# Patient Record
Sex: Male | Born: 1945 | Race: Black or African American | Hispanic: No | Marital: Married | Smoking: Never smoker
Health system: Southern US, Community
[De-identification: ages and names within clinical notes are randomized; demographics above are authoritative.]

## PROBLEM LIST (undated history)

## (undated) DIAGNOSIS — F419 Anxiety disorder, unspecified: Secondary | ICD-10-CM

## (undated) DIAGNOSIS — T7840XA Allergy, unspecified, initial encounter: Secondary | ICD-10-CM

## (undated) DIAGNOSIS — F329 Major depressive disorder, single episode, unspecified: Secondary | ICD-10-CM

## (undated) DIAGNOSIS — K219 Gastro-esophageal reflux disease without esophagitis: Secondary | ICD-10-CM

## (undated) DIAGNOSIS — F32A Depression, unspecified: Secondary | ICD-10-CM

## (undated) HISTORY — DX: Allergy, unspecified, initial encounter: T78.40XA

## (undated) HISTORY — DX: Gastro-esophageal reflux disease without esophagitis: K21.9

## (undated) HISTORY — DX: Anxiety disorder, unspecified: F41.9

## (undated) HISTORY — DX: Major depressive disorder, single episode, unspecified: F32.9

## (undated) HISTORY — DX: Depression, unspecified: F32.A

---

## 2000-06-30 ENCOUNTER — Ambulatory Visit (HOSPITAL_COMMUNITY): Admission: RE | Admit: 2000-06-30 | Discharge: 2000-06-30 | Payer: Self-pay | Admitting: Gastroenterology

## 2002-09-30 HISTORY — PX: KNEE SURGERY: SHX244

## 2003-09-05 ENCOUNTER — Ambulatory Visit (HOSPITAL_COMMUNITY): Admission: RE | Admit: 2003-09-05 | Discharge: 2003-09-05 | Payer: Self-pay | Admitting: Gastroenterology

## 2005-05-31 ENCOUNTER — Encounter: Admission: RE | Admit: 2005-05-31 | Discharge: 2005-05-31 | Payer: Self-pay | Admitting: Emergency Medicine

## 2005-06-02 ENCOUNTER — Encounter: Admission: RE | Admit: 2005-06-02 | Discharge: 2005-06-02 | Payer: Self-pay | Admitting: Emergency Medicine

## 2012-01-27 ENCOUNTER — Ambulatory Visit (INDEPENDENT_AMBULATORY_CARE_PROVIDER_SITE_OTHER): Payer: Federal, State, Local not specified - PPO | Admitting: Family Medicine

## 2012-01-27 VITALS — BP 122/83 | HR 68 | Temp 98.3°F | Resp 16 | Ht 70.0 in | Wt 198.0 lb

## 2012-01-27 DIAGNOSIS — H66009 Acute suppurative otitis media without spontaneous rupture of ear drum, unspecified ear: Secondary | ICD-10-CM

## 2012-01-27 DIAGNOSIS — H9203 Otalgia, bilateral: Secondary | ICD-10-CM

## 2012-01-27 DIAGNOSIS — J309 Allergic rhinitis, unspecified: Secondary | ICD-10-CM

## 2012-01-27 DIAGNOSIS — J329 Chronic sinusitis, unspecified: Secondary | ICD-10-CM

## 2012-01-27 DIAGNOSIS — H6690 Otitis media, unspecified, unspecified ear: Secondary | ICD-10-CM

## 2012-01-27 DIAGNOSIS — H9209 Otalgia, unspecified ear: Secondary | ICD-10-CM

## 2012-01-27 MED ORDER — FLUTICASONE PROPIONATE 50 MCG/ACT NA SUSP: 2.0000 | Freq: Every day | NASAL | Status: DC

## 2012-01-27 MED ORDER — AMOXICILLIN 875 MG PO TABS: 875.0000 mg | ORAL_TABLET | Freq: Two times a day (BID) | ORAL | Status: AC

## 2012-01-27 NOTE — Progress Notes (Signed)
S:  66 yo at [employer] for one more year.  Complains of sinus congestion (blocked nasal passages) x 2 weeks (h/o allergies), otalgia and sore throat.  The latter two are more recent, and otalgia was originally on the left, and is now more on the right  O:  NAD Moderate erythema bilateral TM's Throat ok Nose:  Red passges Neck supple, no adenopathy Oroph:  Clear  A:  Otitis with sinusitis  P:  Amoxicillin x 7 d

## 2012-05-26 ENCOUNTER — Ambulatory Visit: Payer: Federal, State, Local not specified - PPO

## 2012-05-26 ENCOUNTER — Ambulatory Visit (INDEPENDENT_AMBULATORY_CARE_PROVIDER_SITE_OTHER): Payer: Federal, State, Local not specified - PPO | Admitting: Family Medicine

## 2012-05-26 VITALS — BP 124/82 | HR 73 | Temp 98.1°F | Resp 16 | Ht 69.58 in | Wt 198.4 lb

## 2012-05-26 DIAGNOSIS — K219 Gastro-esophageal reflux disease without esophagitis: Secondary | ICD-10-CM

## 2012-05-26 DIAGNOSIS — M79609 Pain in unspecified limb: Secondary | ICD-10-CM

## 2012-05-26 DIAGNOSIS — B009 Herpesviral infection, unspecified: Secondary | ICD-10-CM

## 2012-05-26 DIAGNOSIS — M79671 Pain in right foot: Secondary | ICD-10-CM

## 2012-05-26 LAB — URIC ACID: Uric Acid, Serum: 7 mg/dL (ref 4.0–7.8)

## 2012-05-26 MED ORDER — VALACYCLOVIR HCL 1 G PO TABS: ORAL_TABLET | ORAL | Status: DC

## 2012-05-26 NOTE — Patient Instructions (Addendum)
Fever Blisters, Herpes Simplex Herpes simplex is a virus. This virus causes fever blisters or cold sores. Fever blisters are small sores on the lips, gums, or roof of the mouth. People often get infected with this herpes virus but do not have any symptoms. The blisters may break out when a person is:  Tired.   Under stress.   Suffering from another infection (such as a cold).   Exposed to sunlight.  The blisters usually heal within 1 week. The virus can be easily passed to other people and to other parts of the body, such as the eyes and sex organs. CAUSES  A virus, herpes simplex, is the cause of fever blisters. This virus can be passed (transmitted) from person to person and is therefore contagious. There are 2 types of herpes simplex virus. Type 1 usually causes oral herpes or fever blisters. Type 2 usually causes genital herpes. Both viruses do have the potential to cause oral and genital infections. However, the type 1 virus causes more than 90% of recurrent fever blister outbreaks.  Herpes simplex virus is highly contagious when fever blisters are present. Close contact, including kissing, can spread the virus. Children often become infected by contact with others who have fever blisters. A child can spread the virus by rubbing the cold sore and touching other children or when other children touch clothing, wipes, or toys contaminated by an infected child with the virus. In adults, about 10% of oral herpes infections are from oral-genital sex with a person who has active genital herpes (type 2).  Type 1 herpes infection is very common, eventually occurring in up to 8 out of 10 otherwise healthy people. Most people become infected before they are 65 years old. The virus usually infects the lips, throat, or mouth. Initial infection in children can be extensive with many lesions throughout the mouth. In adults, the first infection may cause no symptoms. Some adults may develop many fluid-filled  blisters inside and outside the mouth 3 to 5 days after they are initially infected but severe infection is uncommon. Fever, swollen neck glands, and general aches may occur but this is also uncommon. The blisters tend to come together and then collapse. When on the lip, a yellowish crust forms over the sores. Healing of the area without scarring typically occurs within 2 weeks. Once a person is infected, the herpes virus permanently remains alive in the body within a nerve near the cheekbone. It then stays inactive at this site, only to sometimes travel down the nerve to the skin. This causes a recurrence of fever blisters. Recurrent blisters usually break out at the outside edge of the lip or edge of the nostril. Recurrent fever blisters may occasionally occur on the chin, cheeks, or inside the mouth. Recurrent fever blister attacks are usually not as painful and not as numerous as the first infection. Recurrences are less frequent after age 35. Many people who have recurring fever blisters feel itching, tingling, or burning at the lip border. This can occur hours or a couple days before the blister appears.  Factors which weaken the body's immune system may trigger an outbreak or recurrence of herpes. These include some drugs (such as steroids), emotional stress, fever, illness, sleep deprivation, and other injuries. Sunlight may also trigger an outbreak. Many women have recurrences only during their menstrual period.  TREATMENT There is no cure for fever blisters. There is no vaccine for herpes simplex virus.  Certain medicines can relieve some of the pain   and discomfort of the sores or promote more rapid healing. These include ointments that numb the blisters and medicines that control bacterial infections (antibiotics). A number of drugs active against herpes viruses (antivirals), either applied locally as a gel or cream, or taken in pill form, may promote healing by keeping the virus from multiplying  and infecting more local tissue.   Keep fever blisters clean and dry. This helps to prevent bacterial invasion of the virally infected tissues.   Eat a soft, bland diet to avoid irritating the sores.   Be careful not to touch the sores and spread the virus to new sites, such as:   Other areas of the face.   Eyes.   Genitals.   Make sure you do not infect others. Avoid kissing people when a fever blister is present. Avoid touching the sores and then touching others.   Sunscreen on the lips can prevent recurrences if outbreaks are triggered by sunlight. The sunscreen should be put on before going outside and reapplied often while in the sun.   Avoid stress if this seems to cause outbreaks.  HOME CARE INSTRUCTIONS   Only take over-the-counter or prescription medicines for pain, discomfort, or fever as directed by your caregiver. Do not use aspirin.   Do not touch the blisters or pick the scabs. Wash your hands often. Do not touch your eyes without washing your hands first.   Avoid close contact with other people, especially kissing, until blisters heal.   Hot, cold, or salty foods may hurt your mouth. Use a straw to drink. Eating a well-balanced diet will help healing.  SEEK MEDICAL CARE IF:   Your eye feels irritated, painful, or you feel like you have something in your eye.   You develop a fever, feel achy, or see pus instead of clear fluid in the sores. These are signs of a bacterial infection.   You get blisters on your genitals.   You develop new, unexplained symptoms.  MAKE SURE YOU:   Understand these instructions.   Will watch your condition.   Will get help right away if you are not doing well or get worse.  Document Released: 09/16/2005 Document Revised: 09/05/2011 Document Reviewed: 01/21/2008 [facility] Patient Information 2012 [place], [place].  FOOT PAIN  Wear comfortable shoes Take aleve 2 pills twice daily if needed for foot pain  Gout Gout is an  inflammatory condition (arthritis) caused by a buildup of uric acid crystals in the joints. Uric acid is a chemical that is normally present in the blood. Under some circumstances, uric acid can form into crystals in your joints. This causes joint redness, soreness, and swelling (inflammation). Repeat attacks are common. Over time, uric acid crystals can form into masses (tophi) near a joint, causing disfigurement. Gout is treatable and often preventable. CAUSES  The disease begins with elevated levels of uric acid in the blood. Uric acid is produced by your body when it breaks down a naturally found substance called purines. This also happens when you eat certain foods such as meats and fish. Causes of an elevated uric acid level include:  Being passed down from parent to child (heredity).   Diseases that cause increased uric acid production (obesity, psoriasis, some cancers).   Excessive alcohol use.   Diet, especially diets rich in meat and seafood.   Medicines, including certain cancer-fighting drugs (chemotherapy), diuretics, and aspirin.   Chronic kidney disease. The kidneys are no longer able to remove uric acid well.   Problems  with metabolism.  Conditions strongly associated with gout include:  Obesity.   High blood pressure.   High cholesterol.   Diabetes.  Not everyone with elevated uric acid levels gets gout. It is not understood why some people get gout and others do not. Surgery, joint injury, and eating too much of certain foods are some of the factors that can lead to gout. SYMPTOMS   An attack of gout comes on quickly. It causes intense pain with redness, swelling, and warmth in a joint.   Fever can occur.   Often, only one joint is involved. Certain joints are more commonly involved:   Base of the big toe.   Knee.   Ankle.   Wrist.   Finger.  Without treatment, an attack usually goes away in a few days to weeks. Between attacks, you usually will not have  symptoms, which is different from many other forms of arthritis. DIAGNOSIS  Your caregiver will suspect gout based on your symptoms and exam. Removal of fluid from the joint (arthrocentesis) is done to check for uric acid crystals. Your caregiver will give you a medicine that numbs the area (local anesthetic) and use a needle to remove joint fluid for exam. Gout is confirmed when uric acid crystals are seen in joint fluid, using a special microscope. Sometimes, blood, urine, and X-ray tests are also used. TREATMENT  There are 2 phases to gout treatment: treating the sudden onset (acute) attack and preventing attacks (prophylaxis). Treatment of an Acute Attack  Medicines are used. These include anti-inflammatory medicines or steroid medicines.   An injection of steroid medicine into the affected joint is sometimes necessary.   The painful joint is rested. Movement can worsen the arthritis.   You may use warm or cold treatments on painful joints, depending which works best for you.   Discuss the use of coffee, vitamin C, or cherries with your caregiver. These may be helpful treatment options.  Treatment to Prevent Attacks After the acute attack subsides, your caregiver may advise prophylactic medicine. These medicines either help your kidneys eliminate uric acid from your body or decrease your uric acid production. You may need to stay on these medicines for a very long time. The early phase of treatment with prophylactic medicine can be associated with an increase in acute gout attacks. For this reason, during the first few months of treatment, your caregiver may also advise you to take medicines usually used for acute gout treatment. Be sure you understand your caregiver's directions. You should also discuss dietary treatment with your caregiver. Certain foods such as meats and fish can increase uric acid levels. Other foods such as dairy can decrease levels. Your caregiver can give you a list of  foods to avoid. HOME CARE INSTRUCTIONS   Do not take aspirin to relieve pain. This raises uric acid levels.   Only take over-the-counter or prescription medicines for pain, discomfort, or fever as directed by your caregiver.   Rest the joint as much as possible. When in bed, keep sheets and blankets off painful areas.   Keep the affected joint raised (elevated).   Use crutches if the painful joint is in your leg.   Drink enough water and fluids to keep your urine clear or pale yellow. This helps your body get rid of uric acid. Do not drink alcoholic beverages. They slow the passage of uric acid.   Follow your caregiver's dietary instructions. Pay careful attention to the amount of protein you eat. Your daily  diet should emphasize fruits, vegetables, whole grains, and fat-free or low-fat milk products.   Maintain a healthy body weight.  SEEK MEDICAL CARE IF:   You have an oral temperature above 102 F (38.9 C).   You develop diarrhea, vomiting, or any side effects from medicines.   You do not feel better in 24 hours, or you are getting worse.  SEEK IMMEDIATE MEDICAL CARE IF:   Your joint becomes suddenly more tender and you have:   Chills.   An oral temperature above 102 F (38.9 C), not controlled by medicine.  MAKE SURE YOU:   Understand these instructions.   Will watch your condition.   Will get help right away if you are not doing well or get worse.  Document Released: 09/13/2000 Document Revised: 09/05/2011 Document Reviewed: 12/25/2009 [facility] Patient Information 2012 [place], [place].

## 2012-05-26 NOTE — Progress Notes (Signed)
Subjective: 66 year old man who is here for he has painful right large to at the MTP joint. He knows of no injury. Bothering him for about 10 days. He just came on with a sharp pain at the base of the foot. He works a Office manager, used to Development worker, [employer]. He does not play sports. He has not noted any injury. He has not had this before. He has no history of gout. His only regular medication is his Nexium, and some vitamins.  Incidentally he complained about place coming up on his left left lower lip. Says he gets these about every 4 months. His wife also gets them. She said she got some medication for them one time.  Objective: 66 year old man in no major distress. His foot grossly looks normal. Pedal pulses are good. The foot is not swollen or red. There is a tender spot on the ball of the foot at the base of the first MTP joint.  Early fever blister left lower lip  Assessment: Foot pain HSV I  Plan: X-ray, BMET, uric acid  Valcyclovir  Differential includes gout, bone spur, stress fracture, or just some kind of a bruise there.  [facility] reading (PRIMARY) by  Dr. Alwyn Ren Normal xray .  Aleve prn

## 2012-07-10 ENCOUNTER — Ambulatory Visit (INDEPENDENT_AMBULATORY_CARE_PROVIDER_SITE_OTHER): Payer: Federal, State, Local not specified - PPO | Admitting: Radiology

## 2012-07-10 DIAGNOSIS — Z0000 Encounter for general adult medical examination without abnormal findings: Secondary | ICD-10-CM

## 2012-07-10 DIAGNOSIS — Z23 Encounter for immunization: Secondary | ICD-10-CM

## 2013-02-11 ENCOUNTER — Ambulatory Visit (INDEPENDENT_AMBULATORY_CARE_PROVIDER_SITE_OTHER): Payer: Federal, State, Local not specified - PPO | Admitting: Family Medicine

## 2013-02-11 VITALS — BP 120/62 | HR 68 | Temp 97.5°F | Resp 16 | Ht 69.5 in | Wt 189.0 lb

## 2013-02-11 DIAGNOSIS — J029 Acute pharyngitis, unspecified: Secondary | ICD-10-CM

## 2013-02-11 DIAGNOSIS — J309 Allergic rhinitis, unspecified: Secondary | ICD-10-CM

## 2013-02-11 DIAGNOSIS — K219 Gastro-esophageal reflux disease without esophagitis: Secondary | ICD-10-CM | POA: Insufficient documentation

## 2013-02-11 MED ORDER — FIRST-DUKES MOUTHWASH MT SUSP: OROMUCOSAL | Status: DC

## 2013-02-11 MED ORDER — IPRATROPIUM BROMIDE 0.03 % NA SOLN: 2.0000 | Freq: Four times a day (QID) | NASAL | Status: DC

## 2013-02-11 NOTE — Patient Instructions (Addendum)
I will be in touch with the results of your throat culture. Let me know if you are not getting better in the next few days- Sooner if worse.

## 2013-02-11 NOTE — Progress Notes (Signed)
[facility] [address], [place] [address] [phone]  Date:  02/11/2013   Name:  Andrew Wolf   DOB:  1946-02-10   MRN:  [id number]  PCP:  Elvina Sidle, MD    Chief Complaint: No chief complaint on file.   History of Present Illness:  Andrew Wolf is a 67 y.o. very pleasant male patient who presents with the following:  He has noted a ST for about 2 days, but it was worse last night and kept him from sleeping.  He has tried gargling with hydrogen peroxide, which helps for a little while.    He has not noted a fever, no aches or chills.  He does have a cough, and has allergies so he typically has a runny nose.   Cough has been present for about 3 days- not productive.   No GI symptoms He was recently exposed to someone with a ST- a coworker  There are no active problems to display for this patient.   No past medical history on file.  No past surgical history on file.  History  Substance Use Topics  . Smoking status: Passive Smoke Exposure - Never Smoker    Types: Cigars  . Smokeless tobacco: Not on file  . Alcohol Use: Not on file    No family history on file.  No Known Allergies  Medication list has been reviewed and updated.  Current Outpatient Prescriptions on File Prior to Visit  Medication Sig Dispense Refill  . esomeprazole (NEXIUM) 40 MG capsule Take 40 mg by mouth 2 (two) times daily.      . Niacin (VITAMIN B-3 PO) Take 2 tablets by mouth daily.      . valACYclovir (VALTREX) 1000 MG tablet Use 2 tablets initially, then 2 in 12 hours for fever blisters  8 tablet  1  . fluticasone (FLONASE) 50 MCG/ACT nasal spray Place 2 sprays into the nose daily.  1 g  11   No current facility-administered medications on file prior to visit.    Review of Systems:  As per HPI- otherwise negative. Reports no history of BPH or glaucoma   Physical Examination: Filed Vitals:   02/11/13 1347  BP: 120/62  Pulse: 68  Temp: 97.5 F  (36.4 C)  Resp: 16   Filed Vitals:   02/11/13 1347  Height: 5' 9.5" (1.765 m)  Weight: 189 lb (85.73 kg)   Body mass index is 27.52 kg/(m^2). Ideal Body Weight: Weight in (lb) to have BMI = 25: 171.4  GEN: WDWN, NAD, Non-toxic, A & O x 3, looks well HEENT: Atraumatic, Normocephalic. Neck supple. No masses, No LAD.  Bilateral TM wnl, oropharynx normal.  PEERL,EOMI.  Ears and Nose: No external deformity. CV: RRR, No M/G/R. No JVD. No thrill. No extra heart sounds. PULM: CTA B, no wheezes, crackles, rhonchi. No retractions. No resp. distress. No accessory muscle use. EXTR: No c/c/e NEURO Normal gait.  PSYCH: Normally interactive. Conversant. Not depressed or anxious appearing.  Calm demeanor.   Results for orders placed in visit on 02/11/13  POCT RAPID STREP A (OFFICE)      Result Value Range   Rapid Strep A Screen Negative  Negative     Assessment and Plan: Acute pharyngitis - Plan: POCT rapid strep A, Culture, Group A Strep  GERD (gastroesophageal reflux disease)  Allergic rhinitis - Plan: ipratropium (ATROVENT) 0.03 % nasal spray, Diphenhyd-Hydrocort-Nystatin (FIRST-DUKES MOUTHWASH) SUSP  likely viral ST.  atrovent NS for PND.  DMM as needed for pharyngitis.  Patient (or parent if minor) instructed to return to clinic or call if not better in 2-3 day(s).   Signed Abbe Amsterdam, MD

## 2013-02-13 LAB — CULTURE, GROUP A STREP

## 2013-07-15 ENCOUNTER — Ambulatory Visit: Payer: Federal, State, Local not specified - PPO

## 2013-07-15 ENCOUNTER — Ambulatory Visit (INDEPENDENT_AMBULATORY_CARE_PROVIDER_SITE_OTHER): Payer: Federal, State, Local not specified - PPO | Admitting: Internal Medicine

## 2013-07-15 VITALS — BP 150/84 | HR 62 | Temp 97.8°F | Resp 18 | Ht 70.0 in | Wt 195.0 lb

## 2013-07-15 DIAGNOSIS — Z23 Encounter for immunization: Secondary | ICD-10-CM

## 2013-07-15 DIAGNOSIS — M546 Pain in thoracic spine: Secondary | ICD-10-CM

## 2013-07-15 MED ORDER — METHOCARBAMOL 750 MG PO TABS: 750.0000 mg | ORAL_TABLET | Freq: Four times a day (QID) | ORAL | Status: DC

## 2013-07-15 MED ORDER — HYDROCODONE-ACETAMINOPHEN 5-325 MG PO TABS: 1.0000 | ORAL_TABLET | Freq: Four times a day (QID) | ORAL | Status: DC | PRN

## 2013-07-15 NOTE — Patient Instructions (Signed)
Scapular Winging  with Rehab  Scapular winging syndrome is also known as serratus anterior palsy or long thoracic nerve injury. The condition is an uncommon injury to the nervous system. The condition is caused by injury to the long thoracic nerve that runs through the neck and shoulder. Injury to the shoulder, such as a fall or repetitive stress on the shoulder causes the nerve to become stretched. Occasionally the injury is the result of an infection of the nerve. Damage to the long thoracic nerve results in weakness of the serratus anterior muscle. The serratus anterior muscle is responsible for controlling the shoulder blade (scapula). Weakness in this muscle results in a instability (winging) of the scapula. SYMPTOMS   Pain and weakness in the shoulder (usually the back of the shoulder) that is often diffuse or unable to localize.  Loss of or decrease in shoulder function.  Upper back pain while sitting, due to the scapula pressing on the back of the chair.  Visible deformity in the back of the shoulder. CAUSES  Scapular winging is caused by stretching of the long thoracic nerve. Common mechanisms of injury include:  Viral illness.  Repetitive and/or stressful use of the shoulder.  Falling onto the shoulder with the head and neck stretched away from the shoulder. RISK INCREASES WITH:  Contact sports (football, rugby, lacrosse, or soccer).  Activities involving overhead arm movement (baseball, volleyball, or racquet sports).  Poor strength and flexibility. PREVENTION  Warm up and stretch properly before activity.  Allow for adequate recovery between workouts.  Maintain physical fitness:  Strength, flexibility, and endurance.  Cardiovascular fitness.  Learn and use proper technique. When possible, have a coach correct improper technique. PROGNOSIS  Scapular winging normally resolves spontaneously within 18 months. In rare circumstances surgery is recommended.  RELATED  COMPLICATIONS   Permanent nerve damage, including pain, numbness, tingle, or weakness.  Shoulder weakness.  Recurrent shoulder pain.  Inability to compete in athletics. TREATMENT Treatment initially involves resting from any activities that aggravate your symptoms. The use of ice and medication may help reduce pain and inflammation. The use of strengthening and stretching exercises may help reduce pain with activity, specifically shoulder exercises that improve range of motion. These exercises may be performed at home or with referral to a therapist. If symptoms persist for greater than 6 months despite non-surgical (conservative) treatment, then surgery may be recommended. Surgery is only used for the most serious cases and the purpose is to regain function, not to allow an athlete to return to sports. MEDICATION   If pain medication is necessary, then nonsteroidal anti-inflammatory medications, such as aspirin and ibuprofen, or other minor pain relievers, such as acetaminophen, are often recommended.  Do not take pain medication for 7 days before surgery.  Prescription pain relievers may be given if deemed necessary by your caregiver. Use only as directed and only as much as you need. HEAT AND COLD  Cold treatment (icing) relieves pain and reduces inflammation. Cold treatment should be applied for 10 to 15 minutes every 2 to 3 hours for inflammation and pain and immediately after any activity that aggravates your symptoms. Use ice packs or massage the area with a piece of ice (ice massage).  Heat treatment may be used prior to performing the stretching and strengthening activities prescribed by your caregiver, physical therapist, or athletic trainer. Use a heat pack or soak the injury in warm water. SEEK MEDICAL CARE IF:  Treatment seems to offer no benefit, or the condition worsens.  Any medications produce adverse side effects. EXERCISES  RANGE OF MOTION (ROM) AND STRETCHING  EXERCISES - Scapular Winging (Serratus Anterior Palsy, Long Thoracic Nerve Injury)  These exercises may help you when beginning to rehabilitate your injury. Your symptoms may resolve with or without further involvement from your physician, physical therapist or athletic trainer. While completing these exercises, remember:   Restoring tissue flexibility helps normal motion to return to the joints. This allows healthier, less painful movement and activity.  An effective stretch should be held for at least 30 seconds.  A stretch should never be painful. You should only feel a gentle lengthening or release in the stretched tissue. ROM - Pendulum  Bend at the waist so that your right / left arm falls away from your body. Support yourself with your opposite hand on a solid surface, such as a table or a countertop.  Your right / left arm should be perpendicular to the ground. If it is not perpendicular, you need to lean over farther. Relax the muscles in your right / left arm and shoulder as much as possible.  Gently sway your hips and trunk so they move your right / left arm without any use of your right / left shoulder muscles.  Progress your movements so that your right / left arm moves side to side, then forward and backward, and finally, both clockwise and counterclockwise.  Complete __________ repetitions in each direction. Many people use this exercise to relieve discomfort in their shoulder as well as to gain range of motion. Repeat __________ times. Complete this exercise __________ times per day. STRETCH  Flexion, Seated   Sit in a firm chair so that your right / left forearm can rest on a table or on a table or countertop. Your right / left elbow should rest below the height of your shoulder so that your shoulder feels supported and not tense or uncomfortable.  Keeping your right / left shoulder relaxed, lean forward at your waist, allowing your right / left hand to slide forward. Bend  forward until you feel a moderate stretch in your shoulder, but before you feel an increase in your pain.  Hold __________ seconds. Slowly return to your starting position. Repeat __________ times. Complete this exercise __________ times per day.  STRETCH  Flexion, Standing  Stand with good posture. With an underhand grip on your right / left and an overhand grip on the opposite hand, grasp a broomstick or cane so that your hands are a little more than shoulder-width apart.  Keeping your right / left elbow straight and shoulder muscles relaxed, push the stick with your opposite hand to raise your right / left arm in front of your body and then overhead. Raise your arm until you feel a stretch in your right / left shoulder, but before you have increased shoulder pain.  Avoid shrugging your right / left shoulder as your arm rises by keeping your shoulder blade tucked down and toward your mid-back spine. Hold __________ seconds.  Slowly return to the starting position. Repeat __________ times. Complete this exercise __________ times per day. STRETCH  Abduction, Supine  Stand with good posture. With an underhand grip on your right / left and an overhand grip on the opposite hand, grasp a broomstick or cane so that your hands are a little more than shoulder-width apart.  Keeping your right / left elbow straight and shoulder muscles relaxed, push the stick with your opposite hand to raise your right / left arm  out to the side of your body and then overhead. Raise your arm until you feel a stretch in your right / left shoulder, but before you have increased shoulder pain.  Avoid shrugging your right / left shoulder as your arm rises by keeping your shoulder blade tucked down and toward your mid-back spine. Hold __________ seconds.  Slowly return to the starting position. Repeat __________ times. Complete this exercise __________ times per day. ROM  Flexion, Active-Assisted  Lie on your back. You  may bend your knees for comfort.  Grasp a broomstick or cane so your hands are about shoulder-width apart. Your right / left hand should grip the end of the stick/cane so that your hand is positioned "thumbs-up," as if you were about to shake hands.  Using your healthy arm to lead, raise your right / left arm overhead until you feel a gentle stretch in your shoulder. Hold __________ seconds.  Use the stick/cane to assist in returning your right / left arm to its starting position. Repeat __________ times. Complete this exercise __________ times per day.  STRENGTHENING EXERCISES - Scapular Winging (Serratus Anterior Palsy, Long Thoracic Nerve Injury) These exercises may help you when beginning to rehabilitate your injury. They may resolve your symptoms with or without further involvement from your physician, physical therapist or athletic trainer. While completing these exercises, remember:   Muscles can gain both the endurance and the strength needed for everyday activities through controlled exercises.  Complete these exercises as instructed by your physician, physical therapist or athletic trainer. Progress with the resistance and repetition exercises only as your caregiver advises.  You may experience muscle soreness or fatigue, but the pain or discomfort you are trying to eliminate should never worsen during these exercises. If this pain does worsen, stop and make certain you are following the directions exactly. If the pain is still present after adjustments, discontinue the exercise until you can discuss the trouble with your clinician.  During your recovery, avoid activity or exercises which involve actions that place your injured hand or elbow above your head or behind your back or head. These positions stress the tissues which are trying to heal. STRENGTH - Scapular Depression and Adduction   With good posture, sit on a firm chair. Supported your arms in front of you with pillows, arm  rests or a table top. Have your elbows in line with the sides of your body.  Gently draw your shoulder blades down and toward your mid-back spine. Gradually increase the tension without tensing the muscles along the top of your shoulders and the back of your neck.  Hold for __________ seconds. Slowly release the tension and relax your muscles completely before completing the next repetition.  After you have practiced this exercise, remove the arm support and complete it in standing as well as sitting. Repeat __________ times. Complete this exercise __________ times per day.  STRENGTH - Scapular Protractors, Standing   Stand arms-length away from a wall. Place your hands on the wall, keeping your elbows straight.  Begin by dropping your shoulder blades down and toward your mid-back spine.  To strengthen your protractors, keep your shoulder blades down, but slide them forward on your rib cage. It will feel as if you are lifting the back of your rib cage away from the wall. This is a subtle motion and can be challenging to complete. Ask your clinician for further instruction if you are not sure you are doing the exercise correctly.  Hold for  __________ seconds. Slowly return to the starting position, resting the muscles completely before completing the next repetition. Repeat __________ times. Complete this exercise __________ times per day. STRENGTH - Scapular Protractors, Supine  Lie on your back on a firm surface. Extend your right / left arm straight into the air while holding a __________ weight in your hand.  Keeping your head and back in place, lift your shoulder off the floor.  Hold __________ seconds. Slowly return to the starting position and allow your muscles to relax completely before completing the next repetition. Repeat __________ times. Complete this exercise __________ times per day. STRENGTH - Scapular Protractors, Quadruped  Get onto your hands and knees with your  shoulders directly over your hands (or as close as you comfortably can be).  Keeping your elbows locked, lift the back of your rib cage up into your shoulder blades so your mid-back rounds-out. Keep your neck muscles relaxed.  Hold this position for __________ seconds. Slowly return to the starting position and allow your muscles to relax completely before completing the next repetition. Repeat __________ times. Complete this exercise __________ times per day.  STRENGTH  Scapular Depressors  Keeping your feet on the floor, lift your bottom from the seat and lock your elbows.  Keeping your elbows straight, allow gravity to pull your body weight down. Your shoulders will rise toward your ears.  Raise your body against gravity by drawing your shoulder blades down your back, shortening the distance between your shoulders and ears. Although your feet should always maintain contact with the floor, your feet should progressively support less body weight as you get stronger.  Hold __________ seconds. In a controlled and slow manner, lower your body weight to begin the next repetition. Repeat __________ times. Complete this exercise __________ times per day.  STRENGTH - Shoulder Extensors, Prone  Lie on your stomach on a firm surface so that your right / left arm overhangs the edge. Rest your forehead on your opposite forearm. With your thumb facing away from your body and your elbow straight, hold a __________ weight in your hand.  Squeeze your right / left shoulder blade to your mid-back spine and then slowly raise your arm behind you to the height of the bed.  Hold for __________ seconds. Slowly reverse the directions and return to the starting position, controlling the weight as you lower your arm. Repeat __________ times. Complete this exercise __________ times per day.  STRENGTH - Horizontal Abductors Choose one of the two oppositions to complete this exercise. Prone: lying on stomach:  Lie  on your stomach on a firm surface so that your right / left arm overhangs the edge. Rest your forehead on your opposite forearm. With your palm facing the floor and your elbow straight, hold a __________ weight in your hand.  Squeeze your right / left shoulder blade to your mid-back spine and then slowly raise your arm to the height of the bed.  Hold for __________ seconds. Slowly reverse the directions and return to the starting position, controlling the weight as you lower your arm. Repeat __________ times. Complete this exercise __________ times per day. Standing:  Secure a rubber exercise band/tubing so that it is at the height of your shoulders when you are either standing or sitting on a firm arm-less chair.  Grasp an end of the band/tubing in each hand and have your palms face each other. Straighten your elbows and lift your hands straight in front of you at shoulder height.  Step back away from the secured end of band/tubing until it becomes tense.  Squeeze your shoulder blades together. Keeping your elbows locked and your hands at shoulder-height, bring your hands out to your side.  Hold __________ seconds. Slowly ease the tension on the band/tubing as you reverse the directions and return to the starting position. Repeat __________ times. Complete this exercise __________ times per day. STRENGTH - Scapular Retractors  Secure a rubber exercise band/tubing so that it is at the height of your shoulders when you are either standing or sitting on a firm arm-less chair.  With a palm-down grip, grasp an end of the band/tubing in each hand. Straighten your elbows and lift your hands straight in front of you at shoulder height. Step back away from the secured end of band/tubing until it becomes tense.  Squeezing your shoulder blades together, draw your elbows back as you bend them. Keep your upper arm lifted away from your body throughout the exercise.  Hold __________ seconds. Slowly ease  the tension on the band/tubing as you reverse the directions and return to the starting position. Repeat __________ times. Complete this exercise __________ times per day. STRENGTH - Shoulder Extensors   Secure a rubber exercise band/tubing so that it is at the height of your shoulders when you are either standing or sitting on a firm arm-less chair.  With a thumbs-up grip, grasp an end of the band/tubing in each hand. Straighten your elbows and lift your hands straight in front of you at shoulder height. Step back away from the secured end of band/tubing until it becomes tense.  Squeezing your shoulder blades together, pull your hands down to the sides of your thighs. Do not allow your hands to go behind you.  Hold for __________ seconds. Slowly ease the tension on the band/tubing as you reverse the directions and return to the starting position. Repeat __________ times. Complete this exercise __________ times per day.  STRENGTH - Scapular Retractors and External Rotators  Secure a rubber exercise band/tubing so that it is at the height of your shoulders when you are either standing or sitting on a firm arm-less chair.  With a palm-down grip, grasp an end of the band/tubing in each hand. Bend your elbows 90 degrees and lift your elbows to shoulder height at your sides. Step back away from the secured end of band/tubing until it becomes tense.  Squeezing your shoulder blades together, rotate your shoulder so that your upper arm and elbow remain stationary, but your fists travel upward to head-height.  Hold __________ for seconds. Slowly ease the tension on the band/tubing as you reverse the directions and return to the starting position. Repeat __________ times. Complete this exercise __________ times per day.  STRENGTH - Scapular Retractors and External Rotators, Rowing  Secure a rubber exercise band/tubing so that it is at the height of your shoulders when you are either standing or sitting  on a firm arm-less chair.  With a palm-down grip, grasp an end of the band/tubing in each hand. Straighten your elbows and lift your hands straight in front of you at shoulder height. Step back away from the secured end of band/tubing until it becomes tense.  Step 1: Squeeze your shoulder blades together. Bending your elbows, draw your hands to your chest as if you are rowing a boat. At the end of this motion, your hands and elbow should be at shoulder-height and your elbows should be out to your sides.  Step 2: Rotate  your shoulder to raise your hands above your head. Your forearms should be vertical and your upper-arms should be horizontal.  Hold for __________ seconds. Slowly ease the tension on the band/tubing as you reverse the directions and return to the starting position. Repeat __________ times. Complete this exercise __________ times per day.  STRENGTH - Scapular Retractors and Elevators  Secure a rubber exercise band/tubing so that it is at the height of your shoulders when you are either standing or sitting on a firm arm-less chair.  With a thumbs-up grip, grasp an end of the band/tubing in each hand. Step back away from the secured end of band/tubing until it becomes tense.  Squeezing your shoulder blades together, straighten your elbows and lift your hands straight over your head.  Hold for __________ seconds. Slowly ease the tension on the band/tubing as you reverse the directions and return to the starting position. Repeat __________ times. Complete this exercise __________ times per day.  Document Released: 09/16/2005 Document Revised: 12/09/2011 Document Reviewed: 12/29/2008 [facility] Patient Information 2014 [place], [place]. Thoracic Strain You have injured the muscles or tendons that attach to the upper part of your back behind your chest. This injury is called a thoracic strain, thoracic sprain, or mid-back strain.  CAUSES  The cause of thoracic strain varies. A less  severe injury involves pulling a muscle or tendon without tearing it. A more severe injury involves tearing (rupturing) a muscle or tendon. With less severe injuries, there may be little loss of strength. Sometimes, there are breaks (fractures) in the bones to which the muscles are attached. These fractures are rare, unless there was a direct hit (trauma) or you have weak bones due to osteoporosis or age. Longstanding strains may be caused by overuse or improper form during certain movements. Obesity can also increase your risk for back injuries. Sudden strains may occur due to injury or not warming up properly before exercise. Often, there is no obvious cause for a thoracic strain. SYMPTOMS  The main symptom is pain, especially with movement, such as during exercise. DIAGNOSIS  Your caregiver can usually tell what is wrong by taking an X-ray and doing a physical exam. TREATMENT   Physical therapy may be helpful for recovery. Your caregiver can give you exercises to do or refer you to a physical therapist after your pain improves.  After your pain improves, strengthening and conditioning programs appropriate for your sport or occupation may be helpful.  Always warm up before physical activities or athletics. Stretching after physical activity may also help.  Certain over-the-counter medicines may also help. Ask your caregiver if there are medicines that would help you. If this is your first thoracic strain injury, proper care and proper healing time before starting activities should prevent long-term problems. Torn ligaments and tendons require as long to heal as broken bones. Average healing times may be only 1 week for a mild strain. For torn muscles and tendons, healing time may be up to 6 weeks to 2 months. HOME CARE INSTRUCTIONS   Apply ice to the injured area. Ice massages may also be used as directed.  Put ice in a plastic bag.  Place a towel between your skin and the bag.  Leave the  ice on for 15-20 minutes, 3-4 times a day, for the first 2 days.  Only take over-the-counter or prescription medicines for pain, discomfort, or fever as directed by your caregiver.  Keep your appointments for physical therapy if this was prescribed.  Use wraps  and back braces as instructed. SEEK IMMEDIATE MEDICAL CARE IF:   You have an increase in bruising, swelling, or pain.  Your pain has not improved with medicines.  You develop new shortness of breath, chest pain, or fever.  Problems seem to be getting worse rather than better. MAKE SURE YOU:   Understand these instructions.  Will watch your condition.  Will get help right away if you are not doing well or get worse. Document Released: 12/07/2003 Document Revised: 12/09/2011 Document Reviewed: 11/02/2010 [facility] Patient Information 2014 [place], [place].

## 2013-07-15 NOTE — Progress Notes (Signed)
  Subjective:    Patient ID: Andrew Wolf, male    DOB: 02-21-1946, 67 y.o.   MRN: [id number]  HPI Left subscapular pain 1 month   Review of Systems     Objective:   Physical Exam  Constitutional: He is oriented to person, place, and time. He appears well-developed and well-nourished.  HENT:  Head: Normocephalic.  Eyes: EOM are normal. Pupils are equal, round, and reactive to light.  Neck: Normal range of motion. Neck supple.  Cardiovascular: Normal rate.   Pulmonary/Chest: Effort normal.  Musculoskeletal: Normal range of motion. He exhibits tenderness. He exhibits no edema.       Cervical back: He exhibits normal range of motion, no tenderness, no bony tenderness, no swelling, no edema, no deformity, no laceration, no pain, no spasm and normal pulse.       Thoracic back: He exhibits tenderness, pain and spasm. He exhibits normal range of motion, no bony tenderness, no swelling, no edema, no deformity, no laceration and normal pulse.       Back:  Point tender at Xs Spurling test neg.  Neurological: He is alert and oriented to person, place, and time. No cranial nerve deficit. He exhibits normal muscle tone. Coordination normal.  Skin: No rash noted.  Psychiatric: He has a normal mood and affect. His behavior is normal. Judgment and thought content normal.     [facility] reading (PRIMARY) by  Dr Perrin Maltese elevated right hemi diaphragm       Assessment & Plan:  Thoracic strain/Subscapular pain Massage/Rolphing/Stretching Vicodin hs/Robaxin

## 2013-08-19 ENCOUNTER — Ambulatory Visit (INDEPENDENT_AMBULATORY_CARE_PROVIDER_SITE_OTHER): Payer: Federal, State, Local not specified - PPO | Admitting: Family Medicine

## 2013-08-19 ENCOUNTER — Encounter: Payer: Self-pay | Admitting: Family Medicine

## 2013-08-19 VITALS — BP 126/80 | HR 63 | Temp 98.3°F | Resp 16 | Ht 69.75 in | Wt 190.4 lb

## 2013-08-19 DIAGNOSIS — Z0000 Encounter for general adult medical examination without abnormal findings: Secondary | ICD-10-CM

## 2013-08-19 DIAGNOSIS — E559 Vitamin D deficiency, unspecified: Secondary | ICD-10-CM

## 2013-08-19 DIAGNOSIS — F4329 Adjustment disorder with other symptoms: Secondary | ICD-10-CM

## 2013-08-19 DIAGNOSIS — F438 Other reactions to severe stress: Secondary | ICD-10-CM

## 2013-08-19 DIAGNOSIS — Z23 Encounter for immunization: Secondary | ICD-10-CM

## 2013-08-19 LAB — POCT URINALYSIS DIPSTICK
Bilirubin, UA: NEGATIVE
Blood, UA: NEGATIVE
Glucose, UA: NEGATIVE
Ketones, UA: NEGATIVE
Leukocytes, UA: NEGATIVE
Nitrite, UA: NEGATIVE
Protein, UA: NEGATIVE
Spec Grav, UA: >=1.03
Urobilinogen, UA: 0.2
pH, UA: 5.5

## 2013-08-19 LAB — COMPREHENSIVE METABOLIC PANEL
ALT: 13 U/L (ref 0–53)
AST: 20 U/L (ref 0–37)
Albumin: 4.4 g/dL (ref 3.5–5.2)
Alkaline Phosphatase: 88 U/L (ref 39–117)
BUN: 13 mg/dL (ref 6–23)
CO2: 25 mEq/L (ref 19–32)
Calcium: 9.7 mg/dL (ref 8.4–10.5)
Chloride: 101 mEq/L (ref 96–112)
Creat: 1.18 mg/dL (ref 0.50–1.35)
Glucose, Bld: 86 mg/dL (ref 70–99)
Potassium: 4.2 mEq/L (ref 3.5–5.3)
Sodium: 138 mEq/L (ref 135–145)
Total Bilirubin: 0.6 mg/dL (ref 0.3–1.2)
Total Protein: 7.7 g/dL (ref 6.0–8.3)

## 2013-08-19 LAB — CBC WITH DIFFERENTIAL/PLATELET
Basophils Absolute: 0 10*3/uL (ref 0.0–0.1)
Basophils Relative: 1 % (ref 0–1)
Eosinophils Absolute: 0.1 10*3/uL (ref 0.0–0.7)
Eosinophils Relative: 1 % (ref 0–5)
HCT: 40.7 % (ref 39.0–52.0)
Hemoglobin: 14 g/dL (ref 13.0–17.0)
Lymphocytes Relative: 30 % (ref 12–46)
Lymphs Abs: 1.9 10*3/uL (ref 0.7–4.0)
MCH: 29.8 pg (ref 26.0–34.0)
MCHC: 34.4 g/dL (ref 30.0–36.0)
MCV: 86.6 fL (ref 78.0–100.0)
Monocytes Absolute: 0.3 10*3/uL (ref 0.1–1.0)
Monocytes Relative: 5 % (ref 3–12)
Neutro Abs: 3.9 10*3/uL (ref 1.7–7.7)
Neutrophils Relative %: 63 % (ref 43–77)
Platelets: 375 10*3/uL (ref 150–400)
RBC: 4.7 MIL/uL (ref 4.22–5.81)
RDW: 13.4 % (ref 11.5–15.5)
WBC: 6.2 10*3/uL (ref 4.0–10.5)

## 2013-08-19 LAB — LIPID PANEL
Cholesterol: 193 mg/dL (ref 0–200)
HDL: 43 mg/dL (ref >39)
LDL Cholesterol: 121 mg/dL — ABNORMAL HIGH (ref 0–99)
Total CHOL/HDL Ratio: 4.5 Ratio
Triglycerides: 146 mg/dL (ref <150)
VLDL: 29 mg/dL (ref 0–40)

## 2013-08-19 LAB — POCT UA - MICROSCOPIC ONLY
Bacteria, U Microscopic: NEGATIVE
Casts, Ur, LPF, POC: NEGATIVE
Crystals, Ur, HPF, POC: NEGATIVE
Yeast, UA: NEGATIVE

## 2013-08-19 LAB — IFOBT (OCCULT BLOOD): IFOBT: NEGATIVE

## 2013-08-19 MED ORDER — CITALOPRAM HYDROBROMIDE 20 MG PO TABS: 20.0000 mg | ORAL_TABLET | Freq: Every day | ORAL | Status: DC

## 2013-08-19 MED ORDER — ZOSTER VACCINE LIVE 19400 UNT/0.65ML ~~LOC~~ SOLR: 0.6500 mL | Freq: Once | SUBCUTANEOUS | Status: DC

## 2013-08-19 NOTE — Progress Notes (Signed)
  Subjective:    Patient ID: Andrew Wolf, male    DOB: 08-02-1946, 67 y.o.   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  Wife Porfirio Mylar also works for [employer] Patient under stress and anxiety.  He sees Richard S. Excell Seltzer, Ph.D., [address], [place], [place] [address].   Mother in law with alzheimers is now living with them.  She requires full attention, lifting her out of bed, bathing her and feeding her baby food.  Hospice is assisting.   Review of Systems  Constitutional: Negative.   HENT: Positive for postnasal drip.   Eyes: Positive for itching.  Respiratory: Negative.   Cardiovascular: Negative.   Gastrointestinal: Negative.   Endocrine: Negative.   Genitourinary: Positive for urgency.  Musculoskeletal: Positive for myalgias.  Skin: Negative.   Allergic/Immunologic: Positive for environmental allergies.  Neurological: Negative.   Hematological: Negative.   Psychiatric/Behavioral: Positive for sleep disturbance, decreased concentration and agitation. The patient is nervous/anxious.        Objective:   Physical Exam Alert, NAD HEENT: Unremarkable Chest: Clear Heart: Regular no murmur Neck: Supple, no adenopathy, palpable midline thyroid without nodularity with possible slight enlargement Abdomen: Soft nontender without HSM or hernia Genitalia: No hernia, normal circumcised male Rectal: Normal Results for orders placed in visit on 08/19/13  POCT UA - MICROSCOPIC ONLY      Result Value Range   WBC, Ur, HPF, POC 0-3     RBC, urine, microscopic 0-2     Bacteria, U Microscopic neg     Mucus, UA small     Epithelial cells, urine per micros 0-2     Crystals, Ur, HPF, POC neg     Casts, Ur, LPF, POC neg     Yeast, UA neg    POCT URINALYSIS DIPSTICK      Result Value Range   Color, UA yellow     Clarity, UA clear     Glucose, UA neg     Bilirubin, UA neg     Ketones, UA neg     Spec Grav, UA >=1.030     Blood, UA neg     pH, UA 5.5     Protein, UA  neg     Urobilinogen, UA 0.2     Nitrite, UA neg     Leukocytes, UA Negative    IFOBT (OCCULT BLOOD)      Result Value Range   IFOBT Negative         Assessment & Plan:  Patient is obviously under a lot of stress with issues at home and work is also an issue. I think is reasonable that he take a leave of absence and possibly retire. His exam is fairly normal today.  Plan:Annual physical exam - Plan: CBC with Differential, Comprehensive metabolic panel, Lipid panel, POCT UA - Microscopic Only, POCT urinalysis dipstick, PSA, IFOBT POC (occult bld, rslt in office)  Need for zoster vaccination - Plan: zoster vaccine live, PF, (ZOSTAVAX) 19147 UNT/0.65ML injection  Unspecified vitamin D deficiency - Plan: Vitamin D, 25-hydroxy  Stress and adjustment reaction - Plan: citalopram (CELEXA) 20 MG tablet, TSH  Need for Tdap vaccination - Plan: Tdap vaccine greater than or equal to 7yo IM  Need for prophylactic vaccination against Streptococcus pneumoniae (pneumococcus) - Plan: Pneumococcal polysaccharide vaccine 23-valent greater than or equal to 2yo subcutaneous/IM  Signed, Elvina Sidle, MD

## 2013-08-20 LAB — TSH: TSH: 4.873 u[IU]/mL — ABNORMAL HIGH (ref 0.350–4.500)

## 2013-08-20 LAB — VITAMIN D 25 HYDROXY (VIT D DEFICIENCY, FRACTURES): Vit D, 25-Hydroxy: 48 ng/mL (ref 30–89)

## 2013-08-20 LAB — PSA: PSA: 1.27 ng/mL (ref <=4.00)

## 2013-08-23 ENCOUNTER — Encounter: Payer: Self-pay | Admitting: *Deleted

## 2013-12-05 ENCOUNTER — Other Ambulatory Visit: Payer: Self-pay | Admitting: Family Medicine

## 2014-12-20 IMAGING — CR DG CHEST 2V
2 series · 2 of 2 positions shown · non-contrast
Comparison: None.

CLINICAL DATA: Left shoulder pain.

EXAM:
CHEST  2 VIEW

[PA]
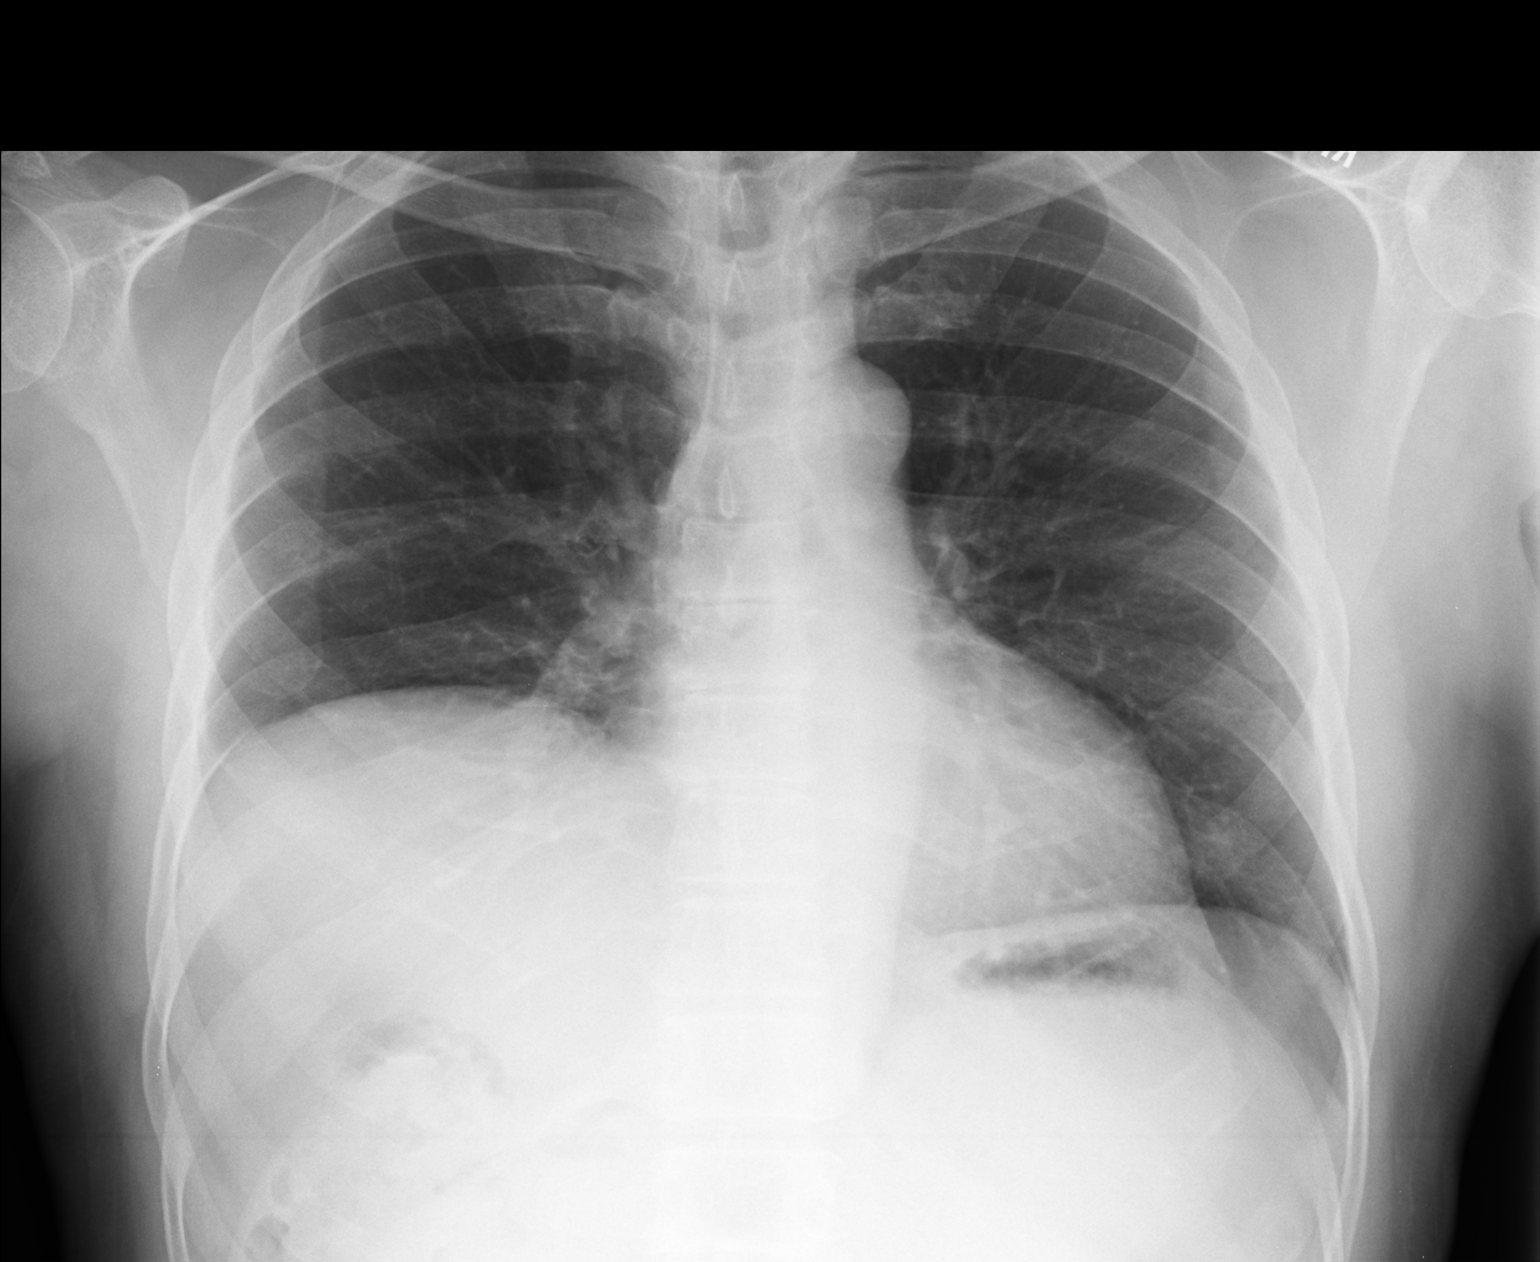

[lateral]
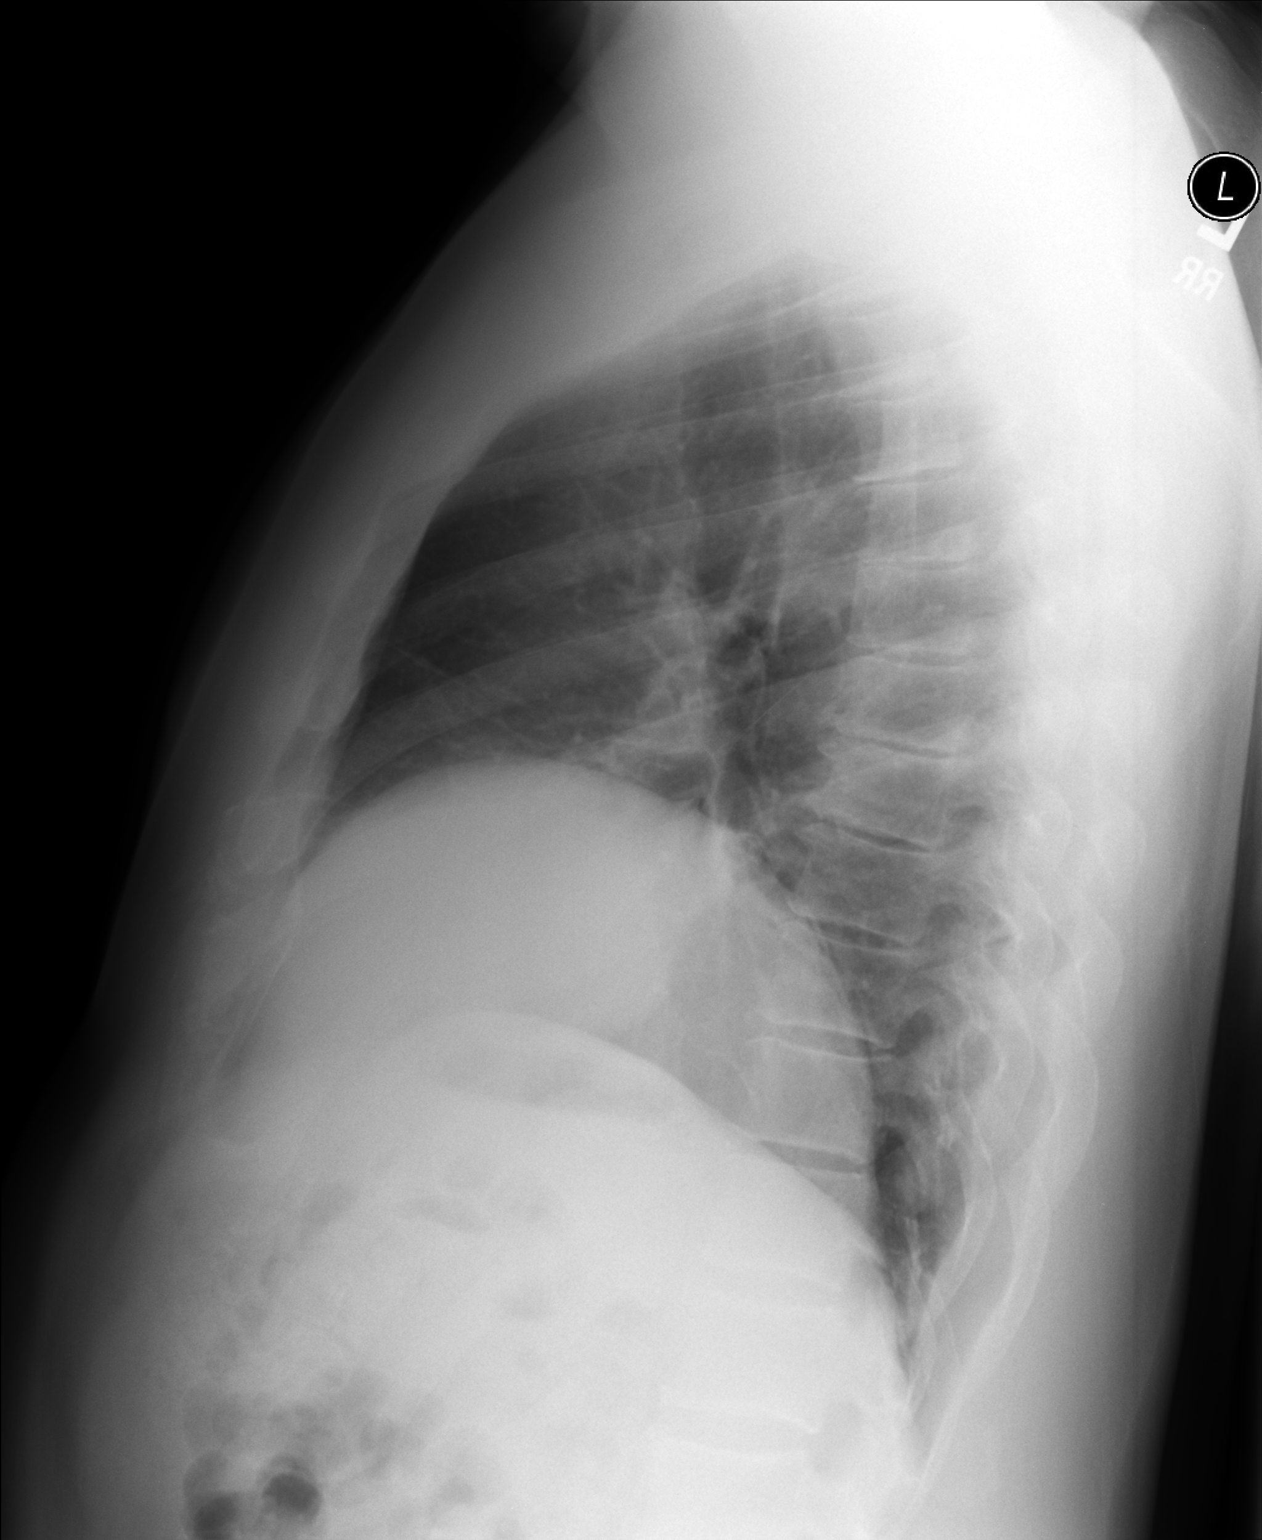

[2 of 2 positions shown; findings below may reference images not displayed]

FINDINGS: Asymmetric elevation of the right hemidiaphragm relative to the left
is noted. Lungs are clear. Heart size is normal. No pneumothorax or
pleural fluid.
IMPRESSION: No acute disease.

## 2016-07-16 ENCOUNTER — Ambulatory Visit (INDEPENDENT_AMBULATORY_CARE_PROVIDER_SITE_OTHER): Payer: Federal, State, Local not specified - PPO | Admitting: Family Medicine

## 2016-07-16 VITALS — BP 128/88 | HR 56 | Temp 97.4°F | Resp 18 | Ht 69.5 in | Wt 182.2 lb

## 2016-07-16 DIAGNOSIS — J309 Allergic rhinitis, unspecified: Secondary | ICD-10-CM | POA: Diagnosis not present

## 2016-07-16 DIAGNOSIS — H61892 Other specified disorders of left external ear: Secondary | ICD-10-CM

## 2016-07-16 DIAGNOSIS — M26629 Arthralgia of temporomandibular joint, unspecified side: Secondary | ICD-10-CM | POA: Diagnosis not present

## 2016-07-16 DIAGNOSIS — Z23 Encounter for immunization: Secondary | ICD-10-CM

## 2016-07-16 MED ORDER — NEOMYCIN-POLYMYXIN-HC 3.5-10000-1 OT SOLN: 3.0000 [drp] | Freq: Three times a day (TID) | OTIC | 0 refills | Status: DC

## 2016-07-16 NOTE — Progress Notes (Signed)
By signing my name below, I, Andrew Wolf, attest that this documentation has been prepared under the direction and in the presence of Andrew Staggers, MD.  Electronically Signed: [name], Medical Scribe. 07/16/16. 3:25 PM.  Subjective:    Patient ID: Andrew Wolf, male    DOB: 1946-02-21, 70 y.o.   MRN: [id number]  HPI Chief Complaint  Patient presents with  . Tingling in Lt Ear    x 2 months    HPI Comments: Andrew Wolf is a 70 y.o. male who presents to the [facility] complaining of tingling in his left ear onset 2 months. Pt describes the feeling as "short hair touching each other" like a tingling feeling. Pt mentions he had ear pain 1 week ago for 4 days and states his constant ear ache has subsided since but has residual intermittent sharp pain in his ear. Pt has tried to clip the hair in his ears and hasn't cut his skin. Pt also reports soreness in his left jaw when he wakes in the morning when he opens his mouth and occasionally hears a popping noise when it occurs. Pt has allergies with postnasal drip. Pt occasionally uses zyrtec PRN when he mows the lawn. Pt always sneezes when he's going and coming from extreme cold and hot environments. Pt has eczema flares on his knees and elbows. Pt denies recent travel in a plane or to the mountains. Pt denies extra noise in his ear with this tingling, and loss of hearing.  Patient Active Problem List   Diagnosis Date Noted  . GERD (gastroesophageal reflux disease) 02/11/2013   Past Medical History:  Diagnosis Date  . Allergy   . Anxiety   . Depression   . GERD (gastroesophageal reflux disease)    Past Surgical History:  Procedure Laterality Date  . KNEE SURGERY  2004   No Known Allergies Prior to Admission medications   Medication Sig Start Date End Date Taking? Authorizing Provider  Cholecalciferol (VITAMIN D-3 PO) Take by mouth.   Yes Historical Provider, MD  esomeprazole (NEXIUM) 40 MG capsule  Take 40 mg by mouth 2 (two) times daily.   Yes Historical Provider, MD  valACYclovir (VALTREX) 1000 MG tablet Use 2 tablets initially, then 2 in 12 hours for fever blisters 05/26/12  Yes Peyton Najjar, MD  citalopram (CELEXA) 20 MG tablet TAKE 1 TABLET BY MOUTH EVERY DAY Patient not taking: Reported on 07/16/2016    Elvina Sidle, MD  zoster vaccine live, PF, (ZOSTAVAX) 40981 UNT/0.65ML injection Inject 19,400 Units into the skin once. Patient not taking: Reported on 07/16/2016 08/19/13   Elvina Sidle, MD   Social History   Social History  . Marital status: Married    Spouse name: N/A  . Number of children: N/A  . Years of education: N/A   Occupational History  . Not on file.   Social History Main Topics  . Smoking status: Passive Smoke Exposure - Never Smoker    Types: Cigars  . Smokeless tobacco: Not on file  . Alcohol use 0.6 oz/week    1 Glasses of wine per week  . Drug use: No  . Sexual activity: Yes   Other Topics Concern  . Not on file   Social History Narrative  . No narrative on file   Review of Systems  HENT: Positive for ear pain (subsided) and postnasal drip. Negative for hearing loss and sneezing.   Allergic/Immunologic: Positive for environmental allergies.  Objective:  Physical Exam  Constitutional: He is oriented to person, place, and time. He appears well-developed and well-nourished.  HENT:  Head: Normocephalic and atraumatic.  Right Ear: Tympanic membrane and external ear normal.  Left Ear: External ear normal. Tympanic membrane is erythematous.  Nose: No rhinorrhea.  Mouth/Throat: Oropharynx is clear and moist and mucous membranes are normal. No oropharyngeal exudate or posterior oropharyngeal erythema.  Dry skin on bilateral canal TM appears to be nl Minimal erythem on the left canal vs the right: No edema. No exudate. No vesicles Slight tenderness along with the left TMJ but no popping or clicking appreciated  Eyes: Conjunctivae are  normal. Pupils are equal, round, and reactive to light.  Neck: Neck supple.  Cardiovascular: Normal rate, regular rhythm, normal heart sounds and intact distal pulses.  Exam reveals no friction rub.   No murmur heard. Pulmonary/Chest: Effort normal and breath sounds normal. No respiratory distress. He has no wheezes. He has no rhonchi. He has no rales.  Abdominal: Soft. There is no tenderness.  Lymphadenopathy:    He has no cervical adenopathy.  Neurological: He is alert and oriented to person, place, and time.  Skin: Skin is warm and dry. No rash noted.  Psychiatric: He has a normal mood and affect. His behavior is normal.  Vitals reviewed.   Assessment & Plan:   Andrew Wolf is a 70 y.o. male Irritation of left external auditory canal - Plan: neomycin-polymyxin-hydrocortisone (CORTISPORIN) otic solution  - hx of eczema, dry/irritated skin of canal, slight erythema left greater than right. Otherwise TM appeared within normal limits. May have component of ETD/allergic rhinitis with noise heard in the ear, but no hearing deficit. No further pain.  - Trial of Cortisporin Otic solution, treat allergies with steroid nasal spray or antihistamine, then if not improving within 1 week, consider ENT eval. Sooner if pain or change in hearing.  Flu vaccine need - Plan: Flu Vaccine QUAD 36+ mos IM  Chronic allergic rhinitis, unspecified seasonality, unspecified trigger  - Restart over-the-counter treatments.  TMJ syndrome  - Handout given, minimal symptoms. RTC precautions if persistent   Meds ordered this encounter  Medications  . neomycin-polymyxin-hydrocortisone (CORTISPORIN) otic solution    Sig: Place 3 drops into the left ear 3 (three) times daily.    Dispense:  10 mL    Refill:  0   Patient Instructions     The ear canal does appear slightly dry and irritated, but not overtly infected at this time. Start the ear drops 3 times per day, Flonase or other steroid nasal spray for  allergies, and Advil as needed for TMJ pain. If the your symptoms are not improving in the next week, let me know and I will refer you to ear nose and throat. Return sooner if any worsening including if any effect on your hearing.   Temporomandibular Joint Syndrome Temporomandibular joint (TMJ) syndrome is a condition that affects the joints between your jaw and your skull. The TMJs are located near your ears and allow your jaw to open and close. These joints and the nearby muscles are involved in all movements of the jaw. People with TMJ syndrome have pain in the area of these joints and muscles. Chewing, biting, or other movements of the jaw can be difficult or painful. TMJ syndrome can be caused by various things. In many cases, the condition is mild and goes away within a few weeks. For some people, the condition can become a long-term problem. CAUSES  Possible causes of TMJ syndrome include:  Grinding your teeth or clenching your jaw. Some people do this when they are under stress.  Arthritis.  Injury to the jaw.  Head or neck injury.  Teeth or dentures that are not aligned well. In some cases, the cause of TMJ syndrome may not be known. SIGNS AND SYMPTOMS The most common symptom is an aching pain on the side of the head in the area of the TMJ. Other symptoms may include:  Pain when moving your jaw, such as when chewing or biting.  Being unable to open your jaw all the way.  Making a clicking sound when you open your mouth.  Headache.  Earache.  Neck or shoulder pain. DIAGNOSIS Diagnosis can usually be made based on your symptoms, your medical history, and a physical exam. Your health care provider may check the range of motion of your jaw. Imaging tests, such as X-rays or an MRI, are sometimes done. You may need to see your dentist to determine if your teeth and jaw are lined up correctly. TREATMENT TMJ syndrome often goes away on its own. If treatment is needed, the options  may include:  Eating soft foods and applying ice or heat.  Medicines to relieve pain or inflammation.  Medicines to relax the muscles.  A splint, bite plate, or mouthpiece to prevent teeth grinding or jaw clenching.  Relaxation techniques or counseling to help reduce stress.  Transcutaneous electrical nerve stimulation (TENS). This helps to relieve pain by applying an electrical current through the skin.  Acupuncture. This is sometimes helpful to relieve pain.  Jaw surgery. This is rarely needed. HOME CARE INSTRUCTIONS  Take medicines only as directed by your health care provider.  Eat a soft diet if you are having trouble chewing.  Apply ice to the painful area.  Put ice in a plastic bag.  Place a towel between your skin and the bag.  Leave the ice on for 20 minutes, 2-3 times a day.  Apply a warm compress to the painful area as directed.  Massage your jaw area and perform any jaw stretching exercises as recommended by your health care provider.  If you were given a mouthpiece or bite plate, wear it as directed.  Avoid foods that require a lot of chewing. Do not chew gum.  Keep all follow-up visits as directed by your health care provider. This is important. SEEK MEDICAL CARE IF:  You are having trouble eating.  You have new or worsening symptoms. SEEK IMMEDIATE MEDICAL CARE IF:  Your jaw locks open or closed.   This information is not intended to replace advice given to you by your health care provider. Make sure you discuss any questions you have with your health care provider.   Document Released: 06/11/2001 Document Revised: 10/07/2014 Document Reviewed: 04/21/2014 Elsevier Interactive Patient Education 2016 [employer]   IF you received an x-ray today, you will receive an invoice from [facility]. Please contact [facility] at [phone] with questions or concerns regarding your invoice.   IF you received labwork today, you will  receive an invoice from [facility]. Please contact [facility] at [phone] with questions or concerns regarding your invoice.   Our billing staff will not be able to assist you with questions regarding bills from these companies.  You will be contacted with the lab results as soon as they are available. The fastest way to get your results is to activate your My Chart account. Instructions are  located on the last page of this paperwork. If you have not heard from [place] regarding the results in 2 weeks, please contact this office.        I personally performed the services described in this documentation, which was scribed in my presence. The recorded information has been reviewed and considered, and addended by me as needed.   Signed,   Andrew Staggers, MD [facility].  07/16/16 5:02 PM

## 2016-07-16 NOTE — Patient Instructions (Addendum)
The ear canal does appear slightly dry and irritated, but not overtly infected at this time. Start the ear drops 3 times per day, Flonase or other steroid nasal spray for allergies, and Advil as needed for TMJ pain. If the your symptoms are not improving in the next week, let me know and I will refer you to ear nose and throat. Return sooner if any worsening including if any effect on your hearing.   Temporomandibular Joint Syndrome Temporomandibular joint (TMJ) syndrome is a condition that affects the joints between your jaw and your skull. The TMJs are located near your ears and allow your jaw to open and close. These joints and the nearby muscles are involved in all movements of the jaw. People with TMJ syndrome have pain in the area of these joints and muscles. Chewing, biting, or other movements of the jaw can be difficult or painful. TMJ syndrome can be caused by various things. In many cases, the condition is mild and goes away within a few weeks. For some people, the condition can become a long-term problem. CAUSES Possible causes of TMJ syndrome include:  Grinding your teeth or clenching your jaw. Some people do this when they are under stress.  Arthritis.  Injury to the jaw.  Head or neck injury.  Teeth or dentures that are not aligned well. In some cases, the cause of TMJ syndrome may not be known. SIGNS AND SYMPTOMS The most common symptom is an aching pain on the side of the head in the area of the TMJ. Other symptoms may include:  Pain when moving your jaw, such as when chewing or biting.  Being unable to open your jaw all the way.  Making a clicking sound when you open your mouth.  Headache.  Earache.  Neck or shoulder pain. DIAGNOSIS Diagnosis can usually be made based on your symptoms, your medical history, and a physical exam. Your health care provider may check the range of motion of your jaw. Imaging tests, such as X-rays or an MRI, are sometimes done. You may  need to see your dentist to determine if your teeth and jaw are lined up correctly. TREATMENT TMJ syndrome often goes away on its own. If treatment is needed, the options may include:  Eating soft foods and applying ice or heat.  Medicines to relieve pain or inflammation.  Medicines to relax the muscles.  A splint, bite plate, or mouthpiece to prevent teeth grinding or jaw clenching.  Relaxation techniques or counseling to help reduce stress.  Transcutaneous electrical nerve stimulation (TENS). This helps to relieve pain by applying an electrical current through the skin.  Acupuncture. This is sometimes helpful to relieve pain.  Jaw surgery. This is rarely needed. HOME CARE INSTRUCTIONS  Take medicines only as directed by your health care provider.  Eat a soft diet if you are having trouble chewing.  Apply ice to the painful area.  Put ice in a plastic bag.  Place a towel between your skin and the bag.  Leave the ice on for 20 minutes, 2-3 times a day.  Apply a warm compress to the painful area as directed.  Massage your jaw area and perform any jaw stretching exercises as recommended by your health care provider.  If you were given a mouthpiece or bite plate, wear it as directed.  Avoid foods that require a lot of chewing. Do not chew gum.  Keep all follow-up visits as directed by your health care provider. This is important. SEEK  MEDICAL CARE IF:  You are having trouble eating.  You have new or worsening symptoms. SEEK IMMEDIATE MEDICAL CARE IF:  Your jaw locks open or closed.   This information is not intended to replace advice given to you by your health care provider. Make sure you discuss any questions you have with your health care provider.   Document Released: 06/11/2001 Document Revised: 10/07/2014 Document Reviewed: 04/21/2014 Elsevier Interactive Patient Education 2016 [employer]   IF you received an x-ray today, you will receive an invoice  from [facility]. Please contact [facility] at [phone] with questions or concerns regarding your invoice.   IF you received labwork today, you will receive an invoice from [facility]. Please contact [facility] at [phone] with questions or concerns regarding your invoice.   Our billing staff will not be able to assist you with questions regarding bills from these companies.  You will be contacted with the lab results as soon as they are available. The fastest way to get your results is to activate your My Chart account. Instructions are located on the last page of this paperwork. If you have not heard from us regarding the results in 2 weeks, please contact this office.

## 2017-01-02 ENCOUNTER — Ambulatory Visit (INDEPENDENT_AMBULATORY_CARE_PROVIDER_SITE_OTHER): Payer: Federal, State, Local not specified - PPO

## 2017-01-02 ENCOUNTER — Ambulatory Visit (INDEPENDENT_AMBULATORY_CARE_PROVIDER_SITE_OTHER): Payer: Federal, State, Local not specified - PPO | Admitting: Physician Assistant

## 2017-01-02 VITALS — BP 138/70 | HR 75 | Temp 99.6°F | Resp 18 | Ht 69.5 in | Wt 183.0 lb

## 2017-01-02 DIAGNOSIS — J22 Unspecified acute lower respiratory infection: Secondary | ICD-10-CM

## 2017-01-02 DIAGNOSIS — R058 Other specified cough: Secondary | ICD-10-CM

## 2017-01-02 DIAGNOSIS — J302 Other seasonal allergic rhinitis: Secondary | ICD-10-CM | POA: Diagnosis not present

## 2017-01-02 DIAGNOSIS — R6889 Other general symptoms and signs: Secondary | ICD-10-CM

## 2017-01-02 DIAGNOSIS — R05 Cough: Secondary | ICD-10-CM | POA: Diagnosis not present

## 2017-01-02 MED ORDER — AZITHROMYCIN 250 MG PO TABS: ORAL_TABLET | ORAL | 0 refills | Status: DC

## 2017-01-02 MED ORDER — BENZONATATE 100 MG PO CAPS: 100.0000 mg | ORAL_CAPSULE | Freq: Three times a day (TID) | ORAL | 0 refills | Status: DC | PRN

## 2017-01-02 MED ORDER — IPRATROPIUM BROMIDE 0.03 % NA SOLN: 2.0000 | Freq: Two times a day (BID) | NASAL | 0 refills | Status: DC

## 2017-01-02 MED ORDER — OSELTAMIVIR PHOSPHATE 75 MG PO CAPS: 75.0000 mg | ORAL_CAPSULE | Freq: Two times a day (BID) | ORAL | 0 refills | Status: DC

## 2017-01-02 MED ORDER — CETIRIZINE HCL 10 MG PO TABS: 10.0000 mg | ORAL_TABLET | Freq: Every day | ORAL | 11 refills | Status: DC

## 2017-01-02 NOTE — Patient Instructions (Addendum)
Make sure that you are hydrating well with 64 oz of water.   I would like you to take the medication as prescribed Rest well.   This appears to be flu, but you may be coming onto a lower respiratory infection.  We will treat you for this as well.    IF you received an x-ray today, you will receive an invoice from [facility]. Please contact [facility] at [phone] with questions or concerns regarding your invoice.   IF you received labwork today, you will receive an invoice from [place]. Please contact [facility] at [phone] with questions or concerns regarding your invoice.   Our billing staff will not be able to assist you with questions regarding bills from these companies.  You will be contacted with the lab results as soon as they are available. The fastest way to get your results is to activate your My Chart account. Instructions are located on the last page of this paperwork. If you have not heard from [place] regarding the results in 2 weeks, please contact this office.

## 2017-01-02 NOTE — Progress Notes (Signed)
PRIMARY CARE AT [facility] [address] [place] [address] [phone]  Date:  01/02/2017   Name:  Andrew Wolf   DOB:  23-Aug-1946   MRN:  [id number]  PCP:  No PCP Per Patient    History of Present Illness:  Andrew Wolf is a 71 y.o. male patient who presents to PCP with  Chief Complaint  Patient presents with  . Flu like symptoms    Body aches, Chills, Cough w/chest pain, Sore throat     Patient reports body aches chills, cough and sore throat.   Patient reports 2 days of these symptoms.  He is coughing productive mucus.  No sob or dyspnea, but has some chest discomfort.    Patient Active Problem List   Diagnosis Date Noted  . GERD (gastroesophageal reflux disease) 02/11/2013    Past Medical History:  Diagnosis Date  . Allergy   . Anxiety   . Depression   . GERD (gastroesophageal reflux disease)     Past Surgical History:  Procedure Laterality Date  . KNEE SURGERY  2004    Social History  Substance Use Topics  . Smoking status: Passive Smoke Exposure - Never Smoker    Types: Cigars  . Smokeless tobacco: Never Used  . Alcohol use 0.6 oz/week    1 Glasses of wine per week    Family History  Problem Relation Age of Onset  . Adopted: Yes  . Hypertension Sister   . Hypertension Sister     No Known Allergies  Medication list has been reviewed and updated.  Current Outpatient Prescriptions on File Prior to Visit  Medication Sig Dispense Refill  . Cholecalciferol (VITAMIN D-3 PO) Take by mouth.    . esomeprazole (NEXIUM) 40 MG capsule Take 40 mg by mouth 2 (two) times daily.    . valACYclovir (VALTREX) 1000 MG tablet Use 2 tablets initially, then 2 in 12 hours for fever blisters (Patient not taking: Reported on 01/02/2017) 8 tablet 1  . zoster vaccine live, PF, (ZOSTAVAX) 95621 UNT/0.65ML injection Inject 19,400 Units into the skin once. (Patient not taking: Reported on 07/16/2016) 1 each 0   No current facility-administered medications on file prior to  visit.     ROS ROS otherwise unremarkable unless listed above.  Physical Examination: BP 138/70   Pulse 75   Temp 99.6 F (37.6 C) (Oral)   Resp 18   Ht 5' 9.5" (1.765 m)   Wt 183 lb (83 kg)   SpO2 97%   BMI 26.64 kg/m  Ideal Body Weight: Weight in (lb) to have BMI = 25: 171.4  Physical Exam  Constitutional: He is oriented to person, place, and time. He appears well-developed and well-nourished. No distress.  HENT:  Head: Normocephalic and atraumatic.  Right Ear: Tympanic membrane, external ear and ear canal normal.  Left Ear: Tympanic membrane, external ear and ear canal normal.  Nose: Mucosal edema and rhinorrhea present. Right sinus exhibits no maxillary sinus tenderness and no frontal sinus tenderness. Left sinus exhibits no maxillary sinus tenderness and no frontal sinus tenderness.  Mouth/Throat: No uvula swelling. No oropharyngeal exudate, posterior oropharyngeal edema or posterior oropharyngeal erythema.  Eyes: Conjunctivae, EOM and lids are normal. Pupils are equal, round, and reactive to light. Right eye exhibits normal extraocular motion. Left eye exhibits normal extraocular motion.  Neck: Trachea normal and full passive range of motion without pain. No edema and no erythema present.  Cardiovascular: Normal rate.   Pulmonary/Chest: Effort normal. No respiratory distress.  He has no decreased breath sounds. He has no wheezes. He has no rhonchi.  Amphoric respiratory sounds.  Neurological: He is alert and oriented to person, place, and time.  Skin: Skin is warm and dry. He is not diaphoretic.  Psychiatric: He has a normal mood and affect. His behavior is normal.   Dg Chest 2 View  Result Date: 01/02/2017 CLINICAL DATA:  Cough and sputum production. EXAM: CHEST  2 VIEW COMPARISON:  07/15/2013. FINDINGS: The cardiac silhouette, mediastinal and hilar contours are within normal limits and stable. Mild tortuosity of the thoracic aorta. Minimal streaky right basilar atelectasis  and eventration of right hemidiaphragm. No infiltrates, effusions or edema. The bony thorax is intact. IMPRESSION: No acute cardiopulmonary findings. Electronically Signed   By: Rudie Meyer M.D.   On: 01/02/2017 16:47     Assessment and Plan: Andrew Wolf is a 71 y.o. male who is here today for cc of cough, bodyaches and sore throat.   Will treat with both zpak, and tamiflu within time frame. Seasonal allergic rhinitis, unspecified chronicity, unspecified trigger - Plan: cetirizine (ZYRTEC) 10 MG tablet, benzonatate (TESSALON) 100 MG capsule  Productive cough - Plan: DG Chest 2 View  Flu-like symptoms  Lower respiratory infection (e.g., bronchitis, pneumonia, pneumonitis, pulmonitis) - Plan: azithromycin (ZITHROMAX) 250 MG tablet, ipratropium (ATROVENT) 0.03 % nasal spray, oseltamivir (TAMIFLU) 75 MG capsule  Trena Platt, PA-C [facility] 4/9/201810:30 AM

## 2017-03-02 ENCOUNTER — Other Ambulatory Visit: Payer: Self-pay | Admitting: Physician Assistant

## 2017-03-02 DIAGNOSIS — J22 Unspecified acute lower respiratory infection: Secondary | ICD-10-CM

## 2017-12-29 ENCOUNTER — Encounter: Payer: Self-pay | Admitting: Physician Assistant

## 2018-04-14 ENCOUNTER — Encounter: Payer: Self-pay | Admitting: Family Medicine

## 2018-04-14 ENCOUNTER — Other Ambulatory Visit: Payer: Self-pay

## 2018-04-14 ENCOUNTER — Ambulatory Visit (INDEPENDENT_AMBULATORY_CARE_PROVIDER_SITE_OTHER): Payer: Federal, State, Local not specified - PPO | Admitting: Family Medicine

## 2018-04-14 VITALS — BP 130/66 | HR 71 | Temp 98.2°F | Ht 70.0 in | Wt 177.2 lb

## 2018-04-14 DIAGNOSIS — Z131 Encounter for screening for diabetes mellitus: Secondary | ICD-10-CM

## 2018-04-14 DIAGNOSIS — Z23 Encounter for immunization: Secondary | ICD-10-CM

## 2018-04-14 DIAGNOSIS — Z8639 Personal history of other endocrine, nutritional and metabolic disease: Secondary | ICD-10-CM

## 2018-04-14 DIAGNOSIS — Z13220 Encounter for screening for lipoid disorders: Secondary | ICD-10-CM

## 2018-04-14 DIAGNOSIS — Z0000 Encounter for general adult medical examination without abnormal findings: Secondary | ICD-10-CM | POA: Diagnosis not present

## 2018-04-14 DIAGNOSIS — R12 Heartburn: Secondary | ICD-10-CM

## 2018-04-14 DIAGNOSIS — Z125 Encounter for screening for malignant neoplasm of prostate: Secondary | ICD-10-CM

## 2018-04-14 DIAGNOSIS — R634 Abnormal weight loss: Secondary | ICD-10-CM | POA: Diagnosis not present

## 2018-04-14 DIAGNOSIS — R3915 Urgency of urination: Secondary | ICD-10-CM | POA: Diagnosis not present

## 2018-04-14 DIAGNOSIS — Z130 Encounter for screening for diseases of the blood and blood-forming organs and certain disorders involving the immune mechanism: Secondary | ICD-10-CM

## 2018-04-14 DIAGNOSIS — Z1159 Encounter for screening for other viral diseases: Secondary | ICD-10-CM

## 2018-04-14 LAB — POCT URINALYSIS DIP (MANUAL ENTRY)
BILIRUBIN UA: NEGATIVE
Glucose, UA: NEGATIVE mg/dL
Ketones, POC UA: NEGATIVE mg/dL
Leukocytes, UA: NEGATIVE
Nitrite, UA: NEGATIVE
PROTEIN UA: NEGATIVE mg/dL
RBC UA: NEGATIVE
SPEC GRAV UA: 1.025 (ref 1.010–1.025)
Urobilinogen, UA: 0.2 E.U./dL
pH, UA: 5 (ref 5.0–8.0)

## 2018-04-14 NOTE — Progress Notes (Deleted)
   Subjective:    Patient ID: Andrew Wolf, male    DOB: 02/26/1946, 72 y.o.   MRN: [id number]  HPI   Review of Systems     Objective:   Physical Exam        Assessment & Plan:

## 2018-04-14 NOTE — Patient Instructions (Signed)
     IF you received an x-ray today, you will receive an invoice from [facility]. Please contact [facility] at [phone] with questions or concerns regarding your invoice.   IF you received labwork today, you will receive an invoice from [facility]. Please contact [facility] at [phone] with questions or concerns regarding your invoice.   Our billing staff will not be able to assist you with questions regarding bills from these companies.  You will be contacted with the lab results as soon as they are available. The fastest way to get your results is to activate your My Chart account. Instructions are located on the last page of this paperwork. If you have not heard from us regarding the results in 2 weeks, please contact this office.     

## 2018-04-14 NOTE — Progress Notes (Addendum)
Subjective:  By signing my name below, I, Andrew Wolf, attest that this documentation has been prepared under the direction and in the presence of Shade FloodJeffrey R Lela Gell, MD Electronically Signed: Greer EeAshley Wolf Medical Scribe7/16/2019 at 3:03 PM.    Patient ID: Andrew Wolf, male    DOB: 1946/05/06, 72 y.o.   MRN: [id number] Chief Complaint  Patient presents with  . Annual Exam    CPE    HPI Andrew Larocheommy R Buchmann is a 72 y.o. male who [facility] physical exam. Last physical noted in 2014. Has a history of reflux, allergic rhinitis, prior history of depression and anxiety.   1. GERD has taken histamine in the past 2. Allergic rhinitis, history of taking Valtrex in the past, he was treated for an acute infection April 2018.  Weight loss Patient reported unexpected weight loss, reports it being progressively, for the past 8 months. Reports being 183 pounds, 8 months ago. Denies fevers and night sweats. Reports urgency, ongoing for about a couple of months. Patient reports he noted urgency decreasing when removing caffeinated drinking. Family history of DM in his sister. History of hypothyroidism on low dose medication years ago but not recently.  Heartburn Patient reports he continues to have heartburn and taking Esomeprazole Magnesium 40mg , two times a day prescribed by Dr. Marjorie SmolderBucini.   CA Screening Colonoscopy: Patient had screening in March 2017 with Dr. Matthias HughsBuccini. Prostate CA Screening: Lab Results  Component Value Date   PSA 1.27 08/19/2013  agrees to testing.   Immunizations Immunization History  Administered Date(s) Administered  . Influenza Split 07/10/2012  . Influenza,inj,Quad PF,6+ Mos 07/15/2013, 07/16/2016  . Pneumococcal Polysaccharide-23 08/19/2013  . Tdap 08/19/2013   Tetanus Hep C Screening: HIV Screening: Due for Prevnar  Shingles: Patient reports taking this at [facility], about 3-4 years ago.  Fall Screening Denies falls  within the past year.  Depression Screening [facility] 04/14/2018 01/02/2017 07/16/2016 08/19/2013  Decreased Interest 0 0 0 2  Down, Depressed, Hopeless 0 0 0 3  PHQ - 2 Score 0 0 0 5  Altered sleeping - - - 3  Tired, decreased energy - - - 3  Change in appetite - - - 2  Feeling bad or failure about yourself  - - - 0  Trouble concentrating - - - 3  Moving slowly or fidgety/restless - - - 0  Suicidal thoughts - - - 0  PHQ-9 Score - - - 16    Functional Status Survey: Is the patient deaf or have difficulty hearing?: No Does the patient have difficulty seeing, even when wearing glasses/contacts?: No Does the patient have difficulty concentrating, remembering, or making decisions?: No Does the patient have difficulty walking or climbing stairs?: No Does the patient have difficulty dressing or bathing?: No Does the patient have difficulty doing errands alone such as visiting a doctor's office or shopping?: No   Mental Status Screening 6CIT Screen 04/14/2018  What Year? 0 points  What month? 0 points  What time? 0 points  Count back from 20 0 points  Months in reverse 0 points  Repeat phrase 0 points  Total Score 0      Visual Acuity Screening   Right eye Left eye Both eyes  Without correction:     With correction: 20/15 20/20 20/20    Vision: Follows up with opthamalagist twice a year. Dentist: Follows up once a year. Exercise: Walks with wife, periodically.   Advanced  Directives  Has a living will and healthcare power of attorney, copies requested.   Patient Active Problem List   Diagnosis Date Noted  . GERD (gastroesophageal reflux disease) 02/11/2013   Past Medical History:  Diagnosis Date  . Allergy   . Anxiety   . Depression   . GERD (gastroesophageal reflux disease)    Past Surgical History:  Procedure Laterality Date  . KNEE SURGERY  2004   No Known Allergies Prior to Admission medications   Medication Sig Start Date End Date Taking?  Authorizing Provider  Cholecalciferol (VITAMIN D-3 PO) Take by mouth.   Yes [provider]  esomeprazole (NEXIUM) 40 MG capsule Take 40 mg by mouth 2 (two) times daily.   Yes [provider]  cetirizine (ZYRTEC) 10 MG tablet Take 1 tablet (10 mg total) by mouth daily. Patient not taking: Reported on 04/14/2018 01/02/17   Garnetta Buddy, PA  valACYclovir (VALTREX) 1000 MG tablet Use 2 tablets initially, then 2 in 12 hours for fever blisters Patient not taking: Reported on 01/02/2017 05/26/12   Peyton Najjar, MD   Social History   Socioeconomic History  . Marital status: Married    Spouse name: Not on file  . Number of children: Not on file  . Years of education: Not on file  . Highest education level: Not on file  Occupational History  . Not on file  Social Needs  . Financial resource strain: Not on file  . Food insecurity:    Worry: Not on file    Inability: Not on file  . Transportation needs:    Medical: Not on file    Non-medical: Not on file  Tobacco Use  . Smoking status: Passive Smoke Exposure - Never Smoker  . Smokeless tobacco: Never Used  Substance and Sexual Activity  . Alcohol use: Yes    Alcohol/week: 0.6 oz    Types: 1 Glasses of wine per week  . Drug use: No  . Sexual activity: Yes  Lifestyle  . Physical activity:    Days per week: Not on file    Minutes per session: Not on file  . Stress: Not on file  Relationships  . Social connections:    Talks on phone: Not on file    Gets together: Not on file    Attends religious service: Not on file    Active member of club or organization: Not on file    Attends meetings of clubs or organizations: Not on file    Relationship status: Not on file  . Intimate partner violence:    Fear of current or ex partner: Not on file    Emotionally abused: Not on file    Physically abused: Not on file    Forced sexual activity: Not on file  Other Topics Concern  . Not on file  Social History Narrative   . Not on file     Review of Systems  Constitutional: Positive for unexpected weight change.  13 point review of systems per patient health survey noted.  Negative other than as indicated above     Objective:   Physical Exam  Constitutional: He is oriented to person, place, and time. He appears well-developed and well-nourished.  HENT:  Head: Normocephalic and atraumatic.  Right Ear: External ear normal.  Left Ear: External ear normal.  Mouth/Throat: Oropharynx is clear and moist.  Eyes: Pupils are equal, round, and reactive to light. Conjunctivae and EOM are normal.  Neck: Normal range of  motion. Neck supple. No JVD present. Carotid bruit is not present. No thyromegaly present.  Cardiovascular: Normal rate, regular rhythm, normal heart sounds and intact distal pulses.  No murmur heard. Pulmonary/Chest: Effort normal and breath sounds normal. No respiratory distress. He has no wheezes. He has no rales.  Abdominal: Soft. He exhibits no distension. There is no tenderness. Hernia confirmed negative in the right inguinal area and confirmed negative in the left inguinal area.  Genitourinary:  Genitourinary Comments: Prostate firm, nontender.  Musculoskeletal: Normal range of motion. He exhibits no edema or tenderness.  Lymphadenopathy:    He has no cervical adenopathy.  Neurological: He is alert and oriented to person, place, and time. He has normal reflexes.  Skin: Skin is warm and dry.  Psychiatric: He has a normal mood and affect. His behavior is normal.  Vitals reviewed.   Vitals:   04/14/18 1454  BP: 130/66  Pulse: 71  Temp: 98.2 F (36.8 C)  TempSrc: Oral  SpO2: 97%  Weight: 177 lb 3.2 oz (80.4 kg)  Height: 5\' 10"  (1.778 m)      Assessment & Plan:  Andrew Wolf is a 72 y.o. male Medicare annual wellness visit, subsequent  - anticipatory guidance as below in AVS, screening labs if needed. Health maintenance items as above in HPI discussed/recommended as applicable.    - no concerning responses on depression, fall, or functional status screening. Any positive responses noted as above. Advanced directives discussed as in CHL.   Loss of weight - Plan: POCT urinalysis dipstick, CBC, TSH  -6 pound difference by report.  Start with screening for thyroid issue with TSH, check for anemia, and follow-up to discuss further.   Heartburn  Followed by gastroenterology, stable on PPI-no changes  Screening for prostate cancer - Plan: PSA  - We discussed pros and cons of prostate cancer screening, and after this discussion, he chose to have screening done. PSA obtained, and no concerning findings on DRE.  Did note slight firm prostate, but no apparent nodularity.  If borderline or elevated PSA, would consider repeat exam with urology  Need for prophylactic vaccination against Streptococcus pneumoniae (pneumococcus) - Plan: Pneumococcal conjugate vaccine 13-valent IM given  Screening, anemia, deficiency, iron - Plan: CBC  Need for hepatitis C screening test - Plan: Hepatitis C antibody  Screening for diabetes mellitus - Plan: Comprehensive metabolic panel  Screening for hyperlipidemia - Plan: Lipid panel  Urinary urgency - Plan: Comprehensive metabolic panel, POCT urinalysis dipstick  - urinalysis in office reassuring, check CMP, PSA as above.  History of hypothyroidism - Plan: TSH   No orders of the defined types were placed in this encounter.  Patient Instructions       IF you received an x-ray today, you will receive an invoice from [facility]. Please contact [facility] at [phone] with questions or concerns regarding your invoice.   IF you received labwork today, you will receive an invoice from [facility]. Please contact [facility] at [phone] with questions or concerns regarding your invoice.   Our billing staff will not be able to assist you with questions regarding bills from these companies.  You will be contacted with  the lab results as soon as they are available. The fastest way to get your results is to activate your My Chart account. Instructions are located on the last page of this paperwork. If you have not heard from [place] regarding the results in 2 weeks, please contact this office.  I personally performed the services described in this documentation, which was scribed in my presence. The recorded information has been reviewed and considered for accuracy and completeness, addended by me as needed, and agree with information above.  Signed,   [name], MD Primary Care at [facility].  04/14/18 3:57 PM

## 2018-04-15 LAB — CBC
HEMATOCRIT: 42.6 % (ref 37.5–51.0)
Hemoglobin: 13.1 g/dL (ref 13.0–17.7)
MCH: 29.2 pg (ref 26.6–33.0)
MCHC: 30.8 g/dL — ABNORMAL LOW (ref 31.5–35.7)
MCV: 95 fL (ref 79–97)
Platelets: 305 10*3/uL (ref 150–450)
RBC: 4.48 x10E6/uL (ref 4.14–5.80)
RDW: 12.8 % (ref 12.3–15.4)
WBC: 5.8 10*3/uL (ref 3.4–10.8)

## 2018-04-15 LAB — HEPATITIS C ANTIBODY

## 2018-04-15 LAB — COMPREHENSIVE METABOLIC PANEL
ALBUMIN: 4.3 g/dL (ref 3.5–4.8)
ALT: 13 IU/L (ref 0–44)
AST: 20 IU/L (ref 0–40)
Albumin/Globulin Ratio: 1.5 (ref 1.2–2.2)
Alkaline Phosphatase: 95 IU/L (ref 39–117)
BILIRUBIN TOTAL: 0.4 mg/dL (ref 0.0–1.2)
BUN/Creatinine Ratio: 12 (ref 10–24)
BUN: 14 mg/dL (ref 8–27)
CO2: 23 mmol/L (ref 20–29)
CREATININE: 1.13 mg/dL (ref 0.76–1.27)
Calcium: 9.7 mg/dL (ref 8.6–10.2)
Chloride: 101 mmol/L (ref 96–106)
GFR calc Af Amer: 75 mL/min/{1.73_m2} (ref >59)
GFR calc non Af Amer: 65 mL/min/{1.73_m2} (ref >59)
GLOBULIN, TOTAL: 2.8 g/dL (ref 1.5–4.5)
GLUCOSE: 93 mg/dL (ref 65–99)
Potassium: 4.9 mmol/L (ref 3.5–5.2)
SODIUM: 141 mmol/L (ref 134–144)
Total Protein: 7.1 g/dL (ref 6.0–8.5)

## 2018-04-15 LAB — LIPID PANEL
CHOLESTEROL TOTAL: 199 mg/dL (ref 100–199)
Chol/HDL Ratio: 3.9 ratio (ref 0.0–5.0)
HDL: 51 mg/dL (ref >39)
LDL Calculated: 117 mg/dL — ABNORMAL HIGH (ref 0–99)
Triglycerides: 157 mg/dL — ABNORMAL HIGH (ref 0–149)
VLDL CHOLESTEROL CAL: 31 mg/dL (ref 5–40)

## 2018-04-15 LAB — TSH: TSH: 5.68 u[IU]/mL — AB (ref 0.450–4.500)

## 2018-04-15 LAB — PSA: Prostate Specific Ag, Serum: 1.5 ng/mL (ref 0.0–4.0)

## 2018-04-28 ENCOUNTER — Ambulatory Visit (INDEPENDENT_AMBULATORY_CARE_PROVIDER_SITE_OTHER): Payer: Federal, State, Local not specified - PPO | Admitting: Family Medicine

## 2018-04-28 ENCOUNTER — Encounter: Payer: Self-pay | Admitting: Family Medicine

## 2018-04-28 VITALS — BP 112/77 | HR 70 | Temp 98.2°F | Resp 17 | Ht 70.5 in | Wt 177.0 lb

## 2018-04-28 DIAGNOSIS — R3989 Other symptoms and signs involving the genitourinary system: Secondary | ICD-10-CM

## 2018-04-28 DIAGNOSIS — R7989 Other specified abnormal findings of blood chemistry: Secondary | ICD-10-CM | POA: Diagnosis not present

## 2018-04-28 DIAGNOSIS — R634 Abnormal weight loss: Secondary | ICD-10-CM

## 2018-04-28 DIAGNOSIS — E785 Hyperlipidemia, unspecified: Secondary | ICD-10-CM

## 2018-04-28 NOTE — Progress Notes (Signed)
Subjective:  By signing my name below, I, Andrew Wolf, attest that this documentation has been prepared under the direction and in the presence of Andrew Flood, MD Electronically Signed: Greer Wolf Medical Scribe 04/28/2018 at 11:50 AM   Patient ID: Andrew Wolf, male    DOB: May 26, 1946, 72 y.o.   MRN: [id number] Chief Complaint  Patient presents with  . Follow-up    review labs, discuss weight loss     HPI Andrew Wolf is a 72 y.o. male who presents to Primary Care at [facility] for a follow up of lab review and to discuss weight loss. He was seen for a wellness visit on July 16  at that time he had reported losing weight over the past 8 months, and reported prior weight 183 pounds. 182 pounds in October 2017. He had reported previous hypothyroidism and low dose medication years ago but no recent thyroid medication . He was up to date with his colonoscopy, in 2017. PSA was normal, but possible firm area on exam from last visit.   Weight loss Wt Readings from Last 3 Encounters:  04/28/18 177 lb (80.3 kg)  04/14/18 177 lb 3.2 oz (80.4 kg)  01/02/17 183 lb (83 kg)   Patient denies dark stools or blood in urine. He denies night sweats or fevers. Denies having trouble with breathing. Patient reports feeling slow fatigue over the yeas, no fevers, no acute changes. Patient reports a small white substance rarely after having a hard cough, but no regular cough.  reports it occurs occasionally, not daily. Patient reports walking with wife for exercise. No dyspagia, no hoarseness, no change in voice.   Patient reports that he is retired now and only eats breakfast and dinner. He reports that before retiring he would eat three meals a day. Patient has had a six pound weight loss over the past eight months. Patient reports that [place] and most food being deep fried and mainly pork.  TSH TSH was borderline elevated. Minimal increase for 2014 at 4.87.  Lab Results    Component Value Date   TSH 5.680 (H) 04/14/2018   Patient denies sweating.  Hyperlipemia  Lab Results  Component Value Date   CHOL 199 04/14/2018   HDL 51 04/14/2018   LDLCALC 117 (H) 04/14/2018   TRIG 157 (H) 04/14/2018   CHOLHDL 3.9 04/14/2018  diet as above. The 10-year ASCVD risk score Denman George DC Montez Hageman., et al., 2013) is: 9.6%   Values used to calculate the score:     Age: 46 years     Sex: Male     Is Non-Hispanic African American: Yes     Diabetic: No     Tobacco smoker: No     Systolic Blood Pressure: 112 mmHg     Is BP treated: No     HDL Cholesterol: 51 mg/dL     Total Cholesterol: 199 mg/dL    Patient Active Problem List   Diagnosis Date Noted  . GERD (gastroesophageal reflux disease) 02/11/2013   Past Medical History:  Diagnosis Date  . Allergy   . Anxiety   . Depression   . GERD (gastroesophageal reflux disease)    Past Surgical History:  Procedure Laterality Date  . KNEE SURGERY  2004   No Known Allergies Prior to Admission medications   Medication Sig Start Date End Date Taking? Authorizing Provider  cetirizine (ZYRTEC) 10 MG tablet Take 1 tablet (10 mg total) by mouth daily. Patient  not taking: Reported on 04/14/2018 01/02/17   Trena PlattEnglish, Stephanie D, PA  Cholecalciferol (VITAMIN D-3 PO) Take by mouth.    [provider]  esomeprazole (NEXIUM) 40 MG capsule Take 40 mg by mouth 2 (two) times daily.    [provider]  valACYclovir (VALTREX) 1000 MG tablet Use 2 tablets initially, then 2 in 12 hours for fever blisters Patient not taking: Reported on 01/02/2017 05/26/12   Peyton NajjarHopper, David H, MD   Social History   Socioeconomic History  . Marital status: Married    Spouse name: Not on file  . Number of children: Not on file  . Years of education: Not on file  . Highest education level: Not on file  Occupational History  . Not on file  Social Needs  . Financial resource strain: Not on file  . Food insecurity:    Worry: Not on file     Inability: Not on file  . Transportation needs:    Medical: Not on file    Non-medical: Not on file  Tobacco Use  . Smoking status: Passive Smoke Exposure - Never Smoker  . Smokeless tobacco: Never Used  Substance and Sexual Activity  . Alcohol use: Yes    Alcohol/week: 0.6 oz    Types: 1 Glasses of wine per week  . Drug use: No  . Sexual activity: Yes  Lifestyle  . Physical activity:    Days per week: Not on file    Minutes per session: Not on file  . Stress: Not on file  Relationships  . Social connections:    Talks on phone: Not on file    Gets together: Not on file    Attends religious service: Not on file    Active member of club or organization: Not on file    Attends meetings of clubs or organizations: Not on file    Relationship status: Not on file  . Intimate partner violence:    Fear of current or ex partner: Not on file    Emotionally abused: Not on file    Physically abused: Not on file    Forced sexual activity: Not on file  Other Topics Concern  . Not on file  Social History Narrative  . Not on file    Review of Systems     Objective:   Physical Exam  Constitutional: He is oriented to person, place, and time. He appears well-developed and well-nourished.  HENT:  Head: Normocephalic and atraumatic.  Mouth/Throat: Oropharynx is clear and moist and mucous membranes are normal. No tonsillar abscesses.  Posterior oropharynx appears normal as well as tonsillar lift.  Eyes: Pupils are equal, round, and reactive to light. EOM are normal.  Neck: Normal range of motion. No JVD present. Carotid bruit is not present.  Cardiovascular: Normal rate, regular rhythm and normal heart sounds.  No murmur heard. Pulmonary/Chest: Effort normal and breath sounds normal. He has no rales.  Abdominal: Soft.  Musculoskeletal: Normal range of motion. He exhibits no edema.  Neurological: He is alert and oriented to person, place, and time.  Skin: Skin is warm and dry.    Psychiatric: He has a normal mood and affect. His behavior is normal. Judgment and thought content normal.  Vitals reviewed.  Vitals:   04/28/18 1120  BP: 112/77  Pulse: 70  Resp: 17  Temp: 98.2 F (36.8 C)  TempSrc: Oral  SpO2: 98%  Weight: 177 lb (80.3 kg)  Height: 5' 10.5" (1.791 m)  Assessment & Plan:  MASAYOSHI COUZENS is a 72 y.o. male Loss of weight - Plan: Sedimentation Rate Elevated TSH - Plan: TSH + free T4, T3, Free  -6 pound weight change.  Stable from last visit.  No significant night sweats fevers or other concerning symptoms on history.  Did have a firm prostate but normal PSA at last visit, will refer to urology.  Borderline TSH but will check individual circulating thyroid hormones before determining restart the medication.  Even so would suspect weight gain rather than weight loss from hypothyroidism.  -Check sed rate, repeat thyroid testing as below  -Monitor weight over the next 3 months, but if he does notice further weight loss, RTC precautions discussed.  Hard, firm prostate - Plan: Ambulatory referral to Urology  -Normal PSA, but will refer to urology for second opinion on exam given above weight loss.   Hyperlipidemia, unspecified hyperlipidemia type   -Discussed ASCVD risk.  There does appear to be some room for improvement with diet.  Will decrease fried food and recheck levels in 3 to 6 months.  Hold on statin for now.   No orders of the defined types were placed in this encounter.  Patient Instructions    We can recheck thyroid test today with individual thyroid hormones to see if medicine needed.  If medication is needed, I will start a very low-dose thyroid medication with close follow-up approximately 6 weeks after starting medication.  Substance that you have coughed up is likely a tonsilith or normal substance produced at the back of the throat.  If that occurs again, please bring it in so we can examine it and discuss further.  Cholesterol  is elevated, and could consider statin medication based on that level but would like to see if decreasing fried food may help.  Recheck levels in the next 3 to 6 months.  I would like to follow up in next 3 months to recheck weight. If you notice new fevers, night sweats or other worsening symptoms sooner - return sooner   Return to the clinic or go to the nearest emergency room if any of your symptoms worsen or new symptoms occur.     IF you received an x-ray today, you will receive an invoice from [facility]. Please contact [facility] at [phone] with questions or concerns regarding your invoice.   IF you received labwork today, you will receive an invoice from [facility]. Please contact [facility] at [phone] with questions or concerns regarding your invoice.   Our billing staff will not be able to assist you with questions regarding bills from these companies.  You will be contacted with the lab results as soon as they are available. The fastest way to get your results is to activate your My Chart account. Instructions are located on the last page of this paperwork. If you have not heard from [place] regarding the results in 2 weeks, please contact this office.       I personally performed the services described in this documentation, which was scribed in my presence. The recorded information has been reviewed and considered for accuracy and completeness, addended by me as needed, and agree with information above.  Signed,   [name], MD Primary Care at [facility].  04/28/18 12:29 PM

## 2018-04-28 NOTE — Patient Instructions (Addendum)
  We can recheck thyroid test today with individual thyroid hormones to see if medicine needed.  If medication is needed, I will start a very low-dose thyroid medication with close follow-up approximately 6 weeks after starting medication.  Substance that you have coughed up is likely a tonsilith or normal substance produced at the back of the throat.  If that occurs again, please bring it in so we can examine it and discuss further.  Cholesterol is elevated, and could consider statin medication based on that level but would like to see if decreasing fried food may help.  Recheck levels in the next 3 to 6 months.  I would like to follow up in next 3 months to recheck weight. If you notice new fevers, night sweats or other worsening symptoms sooner - return sooner   Return to the clinic or go to the nearest emergency room if any of your symptoms worsen or new symptoms occur.     IF you received an x-ray today, you will receive an invoice from [facility]. Please contact [facility] at [phone] with questions or concerns regarding your invoice.   IF you received labwork today, you will receive an invoice from [facility]. Please contact [facility] at [phone] with questions or concerns regarding your invoice.   Our billing staff will not be able to assist you with questions regarding bills from these companies.  You will be contacted with the lab results as soon as they are available. The fastest way to get your results is to activate your My Chart account. Instructions are located on the last page of this paperwork. If you have not heard from us regarding the results in 2 weeks, please contact this office.

## 2018-04-29 LAB — T3, FREE: T3, Free: 2.8 pg/mL (ref 2.0–4.4)

## 2018-04-29 LAB — TSH+FREE T4
Free T4: 1.27 ng/dL (ref 0.82–1.77)
TSH: 4.68 u[IU]/mL — ABNORMAL HIGH (ref 0.450–4.500)

## 2018-04-29 LAB — SEDIMENTATION RATE: SED RATE: 7 mm/h (ref 0–30)

## 2018-05-04 ENCOUNTER — Ambulatory Visit (INDEPENDENT_AMBULATORY_CARE_PROVIDER_SITE_OTHER): Payer: Federal, State, Local not specified - PPO | Admitting: Family Medicine

## 2018-05-04 ENCOUNTER — Encounter: Payer: Self-pay | Admitting: Family Medicine

## 2018-05-04 ENCOUNTER — Other Ambulatory Visit: Payer: Self-pay

## 2018-05-04 VITALS — BP 130/82 | HR 95 | Temp 98.9°F | Ht 70.0 in | Wt 178.8 lb

## 2018-05-04 DIAGNOSIS — S46812A Strain of other muscles, fascia and tendons at shoulder and upper arm level, left arm, initial encounter: Secondary | ICD-10-CM | POA: Diagnosis not present

## 2018-05-04 DIAGNOSIS — S161XXA Strain of muscle, fascia and tendon at neck level, initial encounter: Secondary | ICD-10-CM | POA: Diagnosis not present

## 2018-05-04 MED ORDER — CYCLOBENZAPRINE HCL 5 MG PO TABS: 5.0000 mg | ORAL_TABLET | Freq: Three times a day (TID) | ORAL | 1 refills | Status: DC | PRN

## 2018-05-04 MED ORDER — TRAMADOL HCL 50 MG PO TABS: 50.0000 mg | ORAL_TABLET | Freq: Four times a day (QID) | ORAL | 0 refills | Status: DC | PRN

## 2018-05-04 NOTE — Progress Notes (Signed)
Subjective:    Patient ID: Andrew Wolf, male    DOB: Jan 30, 1946, 72 y.o.   MRN: [id number]  HPI SERGI Wolf is a 72 y.o. male Presents today for: Chief Complaint  Patient presents with  . left shoulder pain    helped people move last wednesday and feels he may have pulled something. Getting worse the last 3 days     Left shoulder pain: Helping friends unload a moving van 5 days ago. Moving heavy object, had to lift overhead, and as pushing the door of the moving truck up, felt a pull on top of the shoulder blade and moves into mid shoulder blade. Some soreness moving toward neck. Pain with turning neck right more than left. No weakness in arms. No pop.  No known prior neck issues.   Tx:  Tylenol, advil otc, heat. - min relief.   Patient Active Problem List   Diagnosis Date Noted  . GERD (gastroesophageal reflux disease) 02/11/2013   Past Medical History:  Diagnosis Date  . Allergy   . Anxiety   . Depression   . GERD (gastroesophageal reflux disease)    Past Surgical History:  Procedure Laterality Date  . KNEE SURGERY  2004   No Known Allergies Prior to Admission medications   Medication Sig Start Date End Date Taking? Authorizing Provider  Cholecalciferol (VITAMIN D-3 PO) Take by mouth.   Yes [provider]  esomeprazole (NEXIUM) 40 MG capsule Take 40 mg by mouth 2 (two) times daily.   Yes [provider]   Social History   Socioeconomic History  . Marital status: Married    Spouse name: Not on file  . Number of children: Not on file  . Years of education: Not on file  . Highest education level: Not on file  Occupational History  . Not on file  Social Needs  . Financial resource strain: Not on file  . Food insecurity:    Worry: Not on file    Inability: Not on file  . Transportation needs:    Medical: Not on file    Non-medical: Not on file  Tobacco Use  . Smoking status: Passive Smoke Exposure - Never Smoker  . Smokeless tobacco:  Never Used  Substance and Sexual Activity  . Alcohol use: Yes    Alcohol/week: 0.6 oz    Types: 1 Glasses of wine per week  . Drug use: No  . Sexual activity: Yes  Lifestyle  . Physical activity:    Days per week: Not on file    Minutes per session: Not on file  . Stress: Not on file  Relationships  . Social connections:    Talks on phone: Not on file    Gets together: Not on file    Attends religious service: Not on file    Active member of club or organization: Not on file    Attends meetings of clubs or organizations: Not on file    Relationship status: Not on file  . Intimate partner violence:    Fear of current or ex partner: Not on file    Emotionally abused: Not on file    Physically abused: Not on file    Forced sexual activity: Not on file  Other Topics Concern  . Not on file  Social History Narrative  . Not on file    Review of Systems As above. No weakness.     Objective:   Physical Exam  Constitutional: He is oriented  to person, place, and time. He appears well-developed and well-nourished. No distress.  HENT:  Head: Normocephalic and atraumatic.  Cardiovascular: Normal rate.  Pulmonary/Chest: Effort normal and breath sounds normal. No respiratory distress.  Musculoskeletal:       Left shoulder: He exhibits normal range of motion (Discomfort in the trapezius and upper shoulder blade with range of motion, but intact), no tenderness, no bony tenderness and normal strength (full rtc strength. ).       Back:  Neurological: He is alert and oriented to person, place, and time.  Reflex Scores:      Tricep reflexes are 2+ on the right side and 2+ on the left side.      Bicep reflexes are 2+ on the right side and 2+ on the left side.      Brachioradialis reflexes are 2+ on the right side and 2+ on the left side. Psychiatric: He has a normal mood and affect.  Vitals reviewed.   Vitals:   05/04/18 1703  BP: 130/82  Pulse: 95  Temp: 98.9 F (37.2 C)    TempSrc: Oral  SpO2: 99%  Weight: 178 lb 12.8 oz (81.1 kg)  Height: 5\' 10"  (1.778 m)       Assessment & Plan:   [name] is a 72 y.o. male Strain of neck muscle, initial encounter - Plan: traMADol (ULTRAM) 50 MG tablet, cyclobenzaprine (FLEXERIL) 5 MG tablet  Strain of left trapezius muscle, initial encounter - Plan: traMADol (ULTRAM) 50 MG tablet, cyclobenzaprine (FLEXERIL) 5 MG tablet  Suspected trapezius strain, although differential includes cervical source.  No midline bony tenderness, no weakness, reassuring reflexes.  Initial imaging deferred  -initial trial of Flexeril, over-the-counter Advil, heat or ice, and other symptomatic care.  Tramadol provided if needed for more severe pain, cautioned on combination of Flexeril.  -Recheck 1 week if not improving, sooner if worse.  Meds ordered this encounter  Medications  . traMADol (ULTRAM) 50 MG tablet    Sig: Take 1 tablet (50 mg total) by mouth every 6 (six) hours as needed.    Dispense:  20 tablet    Refill:  0  . cyclobenzaprine (FLEXERIL) 5 MG tablet    Sig: Take 1 tablet (5 mg total) by mouth 3 (three) times daily as needed for muscle spasms (start qhs prn due to sedation).    Dispense:  15 tablet    Refill:  1   Patient Instructions    It appears that you have a trapezius muscle strain, or paraspinal muscle strain, but as we discussed a pinched nerve from the neck can also appear similar.  Reflexes and strength appear okay today.  Initially can try Advil over-the-counter, Flexeril muscle relaxant up to every 8 hours, but start that at bedtime tonight.  If still having significant pain with those medications, can try the tramadol but be very careful combining those 2 sedating medications as they can also cause dizziness or increased risk of falls.  Please follow-up with me in 1 week if not significantly improved, sooner if worse.  RICE for Routine Care of Injuries Many injuries can be cared for using rest, ice,  compression, and elevation (RICE therapy). Using RICE therapy can help to lessen pain and swelling. It can help your body to heal. Rest Reduce your normal activities and avoid using the injured part of your body. You can go back to your normal activities when you feel okay and your doctor says it is okay. Ice Do not  put ice on your bare skin.  Put ice in a plastic bag.  Place a towel between your skin and the bag.  Leave the ice on for 20 minutes, 2-3 times a day.  Do this for as long as told by your doctor. Compression Compression means putting pressure on the injured area. This can be done with an elastic bandage. If an elastic bandage has been applied:  Remove and reapply the bandage every 3-4 hours or as told by your doctor.  Make sure the bandage is not wrapped too tight. Wrap the bandage more loosely if part of your body beyond the bandage is blue, swollen, cold, painful, or loses feeling (numb).  See your doctor if the bandage seems to make your problems worse.  Elevation Elevation means keeping the injured area raised. Raise the injured area above your heart or the center of your chest if you can. When should I get help? You should get help if:  You keep having pain and swelling.  Your symptoms get worse.  Get help right away if: You should get help right away if:  You have sudden bad pain at or below the area of your injury.  You have redness or more swelling around your injury.  You have tingling or numbness at or below the injury that does not go away when you take off the bandage.  This information is not intended to replace advice given to you by your health care provider. Make sure you discuss any questions you have with your health care provider. Document Released: 03/04/2008 Document Revised: 08/13/2016 Document Reviewed: 08/24/2014 Elsevier Interactive Patient Education  2017 [employer]    IF you received an x-ray today, you will receive an invoice  from [facility]. Please contact [facility] at [phone] with questions or concerns regarding your invoice.   IF you received labwork today, you will receive an invoice from [facility]. Please contact [facility] at [phone] with questions or concerns regarding your invoice.   Our billing staff will not be able to assist you with questions regarding bills from these companies.  You will be contacted with the lab results as soon as they are available. The fastest way to get your results is to activate your My Chart account. Instructions are located on the last page of this paperwork. If you have not heard from us regarding the results in 2 weeks, please contact this office.       Signed,   Meredith StaggersJeffrey Dierdre Mccalip, MD Primary Care at [facility].  05/09/18 10:30 AM

## 2018-05-04 NOTE — Patient Instructions (Addendum)
  It appears that you have a trapezius muscle strain, or paraspinal muscle strain, but as we discussed a pinched nerve from the neck can also appear similar.  Reflexes and strength appear okay today.  Initially can try Advil over-the-counter, Flexeril muscle relaxant up to every 8 hours, but start that at bedtime tonight.  If still having significant pain with those medications, can try the tramadol but be very careful combining those 2 sedating medications as they can also cause dizziness or increased risk of falls.  Please follow-up with me in 1 week if not significantly improved, sooner if worse.  RICE for Routine Care of Injuries Many injuries can be cared for using rest, ice, compression, and elevation (RICE therapy). Using RICE therapy can help to lessen pain and swelling. It can help your body to heal. Rest Reduce your normal activities and avoid using the injured part of your body. You can go back to your normal activities when you feel okay and your doctor says it is okay. Ice Do not put ice on your bare skin.  Put ice in a plastic bag.  Place a towel between your skin and the bag.  Leave the ice on for 20 minutes, 2-3 times a day.  Do this for as long as told by your doctor. Compression Compression means putting pressure on the injured area. This can be done with an elastic bandage. If an elastic bandage has been applied:  Remove and reapply the bandage every 3-4 hours or as told by your doctor.  Make sure the bandage is not wrapped too tight. Wrap the bandage more loosely if part of your body beyond the bandage is blue, swollen, cold, painful, or loses feeling (numb).  See your doctor if the bandage seems to make your problems worse.  Elevation Elevation means keeping the injured area raised. Raise the injured area above your heart or the center of your chest if you can. When should I get help? You should get help if:  You keep having pain and swelling.  Your symptoms get  worse.  Get help right away if: You should get help right away if:  You have sudden bad pain at or below the area of your injury.  You have redness or more swelling around your injury.  You have tingling or numbness at or below the injury that does not go away when you take off the bandage.  This information is not intended to replace advice given to you by your health care provider. Make sure you discuss any questions you have with your health care provider. Document Released: 03/04/2008 Document Revised: 08/13/2016 Document Reviewed: 08/24/2014 Elsevier Interactive Patient Education  2017 [employer]    IF you received an x-ray today, you will receive an invoice from [facility]. Please contact [facility] at [phone] with questions or concerns regarding your invoice.   IF you received labwork today, you will receive an invoice from [facility]. Please contact [facility] at [phone] with questions or concerns regarding your invoice.   Our billing staff will not be able to assist you with questions regarding bills from these companies.  You will be contacted with the lab results as soon as they are available. The fastest way to get your results is to activate your My Chart account. Instructions are located on the last page of this paperwork. If you have not heard from us regarding the results in 2 weeks, please contact this office.

## 2018-05-09 ENCOUNTER — Encounter: Payer: Self-pay | Admitting: Family Medicine

## 2018-05-12 ENCOUNTER — Encounter: Payer: Self-pay | Admitting: Family Medicine

## 2018-05-12 ENCOUNTER — Other Ambulatory Visit: Payer: Self-pay

## 2018-05-12 ENCOUNTER — Ambulatory Visit (INDEPENDENT_AMBULATORY_CARE_PROVIDER_SITE_OTHER): Payer: Federal, State, Local not specified - PPO | Admitting: Family Medicine

## 2018-05-12 VITALS — BP 130/80 | HR 68 | Temp 98.3°F | Ht 70.0 in | Wt 182.0 lb

## 2018-05-12 DIAGNOSIS — S46812D Strain of other muscles, fascia and tendons at shoulder and upper arm level, left arm, subsequent encounter: Secondary | ICD-10-CM

## 2018-05-12 DIAGNOSIS — S161XXD Strain of muscle, fascia and tendon at neck level, subsequent encounter: Secondary | ICD-10-CM | POA: Diagnosis not present

## 2018-05-12 NOTE — Progress Notes (Signed)
Subjective:  By signing my name below, I, Essence Howell, attest that this documentation has been prepared under the direction and in the presence of Shade FloodJeffrey R Rashmi Tallent, MD Electronically Signed: Charline BillsEssence Howell, ED Scribe 05/12/2018 at 12:11 PM.   Patient ID: Andrew Wolf, male    DOB: 06/01/1946, 72 y.o.   MRN: [id number]  Chief Complaint  Patient presents with  . Shoulder Pain    f/u   HPI Andrew Wolf is a 72 y.o. male who presents to [facility] Care at [facility] for f/u of shoulder pain. Last seen 8/5 after helping a friend move. Suspected paraspinal strain of neck vs cervical radiculopathy and trapezius strain. Imagine was initially deferred. Started flexeril, Advil, tramadol prn.  Pt reports improvement in ROM and 50% improvement in pain. States he has been sleeping on the L for more comfort, usually sleep face down. He reports that Flexeril tid works better than tramadol. Flexeril allows him to sleep but doesn't overly sedate him. Denies weakness, radiating pain.  Patient Active Problem List   Diagnosis Date Noted  . GERD (gastroesophageal reflux disease) 02/11/2013   Past Medical History:  Diagnosis Date  . Allergy   . Anxiety   . Depression   . GERD (gastroesophageal reflux disease)    Past Surgical History:  Procedure Laterality Date  . KNEE SURGERY  2004   No Known Allergies Prior to Admission medications   Medication Sig Start Date End Date Taking? Authorizing Provider  Cholecalciferol (VITAMIN D-3 PO) Take by mouth.    [provider]  cyclobenzaprine (FLEXERIL) 5 MG tablet Take 1 tablet (5 mg total) by mouth 3 (three) times daily as needed for muscle spasms (start qhs prn due to sedation). 05/04/18   Shade FloodGreene, Lashina Milles R, MD  esomeprazole (NEXIUM) 40 MG capsule Take 40 mg by mouth 2 (two) times daily.    [provider]  traMADol (ULTRAM) 50 MG tablet Take 1 tablet (50 mg total) by mouth every 6 (six) hours as needed. 05/04/18   Shade FloodGreene, Joby Richart R, MD    Social History   Socioeconomic History  . Marital status: Married    Spouse name: Not on file  . Number of children: Not on file  . Years of education: Not on file  . Highest education level: Not on file  Occupational History  . Not on file  Social Needs  . Financial resource strain: Not on file  . Food insecurity:    Worry: Not on file    Inability: Not on file  . Transportation needs:    Medical: Not on file    Non-medical: Not on file  Tobacco Use  . Smoking status: Passive Smoke Exposure - Never Smoker  . Smokeless tobacco: Never Used  Substance and Sexual Activity  . Alcohol use: Yes    Alcohol/week: 1.0 standard drinks    Types: 1 Glasses of wine per week  . Drug use: No  . Sexual activity: Yes  Lifestyle  . Physical activity:    Days per week: Not on file    Minutes per session: Not on file  . Stress: Not on file  Relationships  . Social connections:    Talks on phone: Not on file    Gets together: Not on file    Attends religious service: Not on file    Active member of club or organization: Not on file    Attends meetings of clubs or organizations: Not on file    Relationship status: Not  on file  . Intimate partner violence:    Fear of current or ex partner: Not on file    Emotionally abused: Not on file    Physically abused: Not on file    Forced sexual activity: Not on file  Other Topics Concern  . Not on file  Social History Narrative  . Not on file   Review of Systems  Musculoskeletal: Positive for arthralgias and neck pain.  Neurological: Negative for weakness.      Objective:   Physical Exam  Constitutional: He is oriented to person, place, and time. He appears well-developed and well-nourished. No distress.  HENT:  Head: Normocephalic and atraumatic.  Eyes: Conjunctivae and EOM are normal.  Neck: Neck supple. No tracheal deviation present.  Neck: No midline bony tenderness. Slight discomfort with flexion. Some discomfort into upper  back musculature and paraspinal neck. Similar discomfort with R rotation, L rotation is intact. Strength is equal including grip on L vs R.  Cardiovascular: Normal rate.  Pulmonary/Chest: Effort normal. No respiratory distress.  Musculoskeletal:  L shoulder: FROM. Full RTC strength. Neg empty can. Tender into upper L trapezius toward rhomboid. Scapula nontender. Cando, AC, clavicle notender. Shoulder nontender.  Neurological: He is alert and oriented to person, place, and time.  Skin: Skin is warm and dry.  Psychiatric: He has a normal mood and affect. His behavior is normal.  Nursing note and vitals reviewed.  Vitals:   05/12/18 1155  BP: 130/80  Pulse: 68  Temp: 98.3 F (36.8 C)  TempSrc: Oral  SpO2: 98%  Weight: 182 lb (82.6 kg)  Height: 5\' 10"  (1.778 m)      Assessment & Plan:  Andrew Wolf is a 72 y.o. male Strain of left trapezius muscle, subsequent encounter  Neck strain, subsequent encounter  Paraspinal strain, trapezius strain versus cervical radiculopathy from neck strain.  Symptoms have improved.  Decided to continue Advil, Flexeril for now, but if not continue to improve into next week, would consider a course of prednisone for possible disc/radiculopathy.  If not improving with prednisone then would recommend imaging -x-ray and likely MRI. RTC precautions if worse.   No orders of the defined types were placed in this encounter.  Patient Instructions   I am glad to hear that your symptoms are improving.  Okay to continue Advil as needed, muscle relaxant up to 3 times per day, but can decrease that to just at bedtime as needed once symptoms have improved.  If you are not continuing  to improve into next week, send me a message through my chart and I can send in a course of prednisone.  If that does not help, then would want to check some imaging with x-ray and possible MRI to look into possible disc herniation.  Please return sooner if any worsening symptoms.   Thanks  for coming in today.     IF you received an x-ray today, you will receive an invoice from [facility]. Please contact [facility] at [phone] with questions or concerns regarding your invoice.   IF you received labwork today, you will receive an invoice from [facility]. Please contact [facility] at [phone] with questions or concerns regarding your invoice.   Our billing staff will not be able to assist you with questions regarding bills from these companies.  You will be contacted with the lab results as soon as they are available. The fastest way to get your results is to activate your My Chart account. Instructions are located  on the last page of this paperwork. If you have not heard from us regarding the results in 2 weeks, please contact this office.       I personally performed the services described in this documentation, which was scribed in my presence. The recorded information has been reviewed and considered for accuracy and completeness, addended by me as needed, and agree with information above.  Signed,   Meredith StaggersJeffrey Tadarrius Burch, MD Primary Care at [facility].  05/12/18 1:38 PM

## 2018-05-12 NOTE — Patient Instructions (Addendum)
I am glad to hear that your symptoms are improving.  Okay to continue Advil as needed, muscle relaxant up to 3 times per day, but can decrease that to just at bedtime as needed once symptoms have improved.  If you are not continuing  to improve into next week, send me a message through my chart and I can send in a course of prednisone.  If that does not help, then would want to check some imaging with x-ray and possible MRI to look into possible disc herniation.  Please return sooner if any worsening symptoms.   Thanks for coming in today.     IF you received an x-ray today, you will receive an invoice from [facility]. Please contact [facility] at [phone] with questions or concerns regarding your invoice.   IF you received labwork today, you will receive an invoice from [facility]. Please contact [facility] at [phone] with questions or concerns regarding your invoice.   Our billing staff will not be able to assist you with questions regarding bills from these companies.  You will be contacted with the lab results as soon as they are available. The fastest way to get your results is to activate your My Chart account. Instructions are located on the last page of this paperwork. If you have not heard from us regarding the results in 2 weeks, please contact this office.

## 2018-05-13 ENCOUNTER — Other Ambulatory Visit: Payer: Self-pay | Admitting: Family Medicine

## 2018-05-13 DIAGNOSIS — S46812A Strain of other muscles, fascia and tendons at shoulder and upper arm level, left arm, initial encounter: Secondary | ICD-10-CM

## 2018-05-13 DIAGNOSIS — S161XXA Strain of muscle, fascia and tendon at neck level, initial encounter: Secondary | ICD-10-CM

## 2018-05-13 NOTE — Telephone Encounter (Signed)
Copied from CRM [id number]. Topic: Quick Communication - See Telephone Encounter >> May 13, 2018 11:13 AM Trula SladeWalter, Linda F wrote: CRM for notification. See Telephone encounter for: 05/13/18. Patient was in the office on 8/5 and was prescribed cyclobenzaprine (FLEXERIL) 5 MG tablet.  He returned on 05/11/18 for a follow up and was told to continue the cyclobenzaprine (FLEXERIL) 5 MG tablet but the prescription was not sent to his pharmacy [facility] on [address].

## 2018-05-13 NOTE — Telephone Encounter (Signed)
Rx refill request: cyclobenzaprine 5 mg             Last filled: 05/04/18  LOV: 05/12/18  PCP: Neva SeatGreene  Pharmacy: verified  Patient discussed continuing medication at visit yesterday- he needs refill.

## 2018-06-09 IMAGING — DX DG CHEST 2V
2 series · 2 of 2 positions shown · non-contrast
Comparison: 07/15/2013.

CLINICAL DATA: Cough and sputum production.

EXAM:
CHEST  2 VIEW

[chest pa]
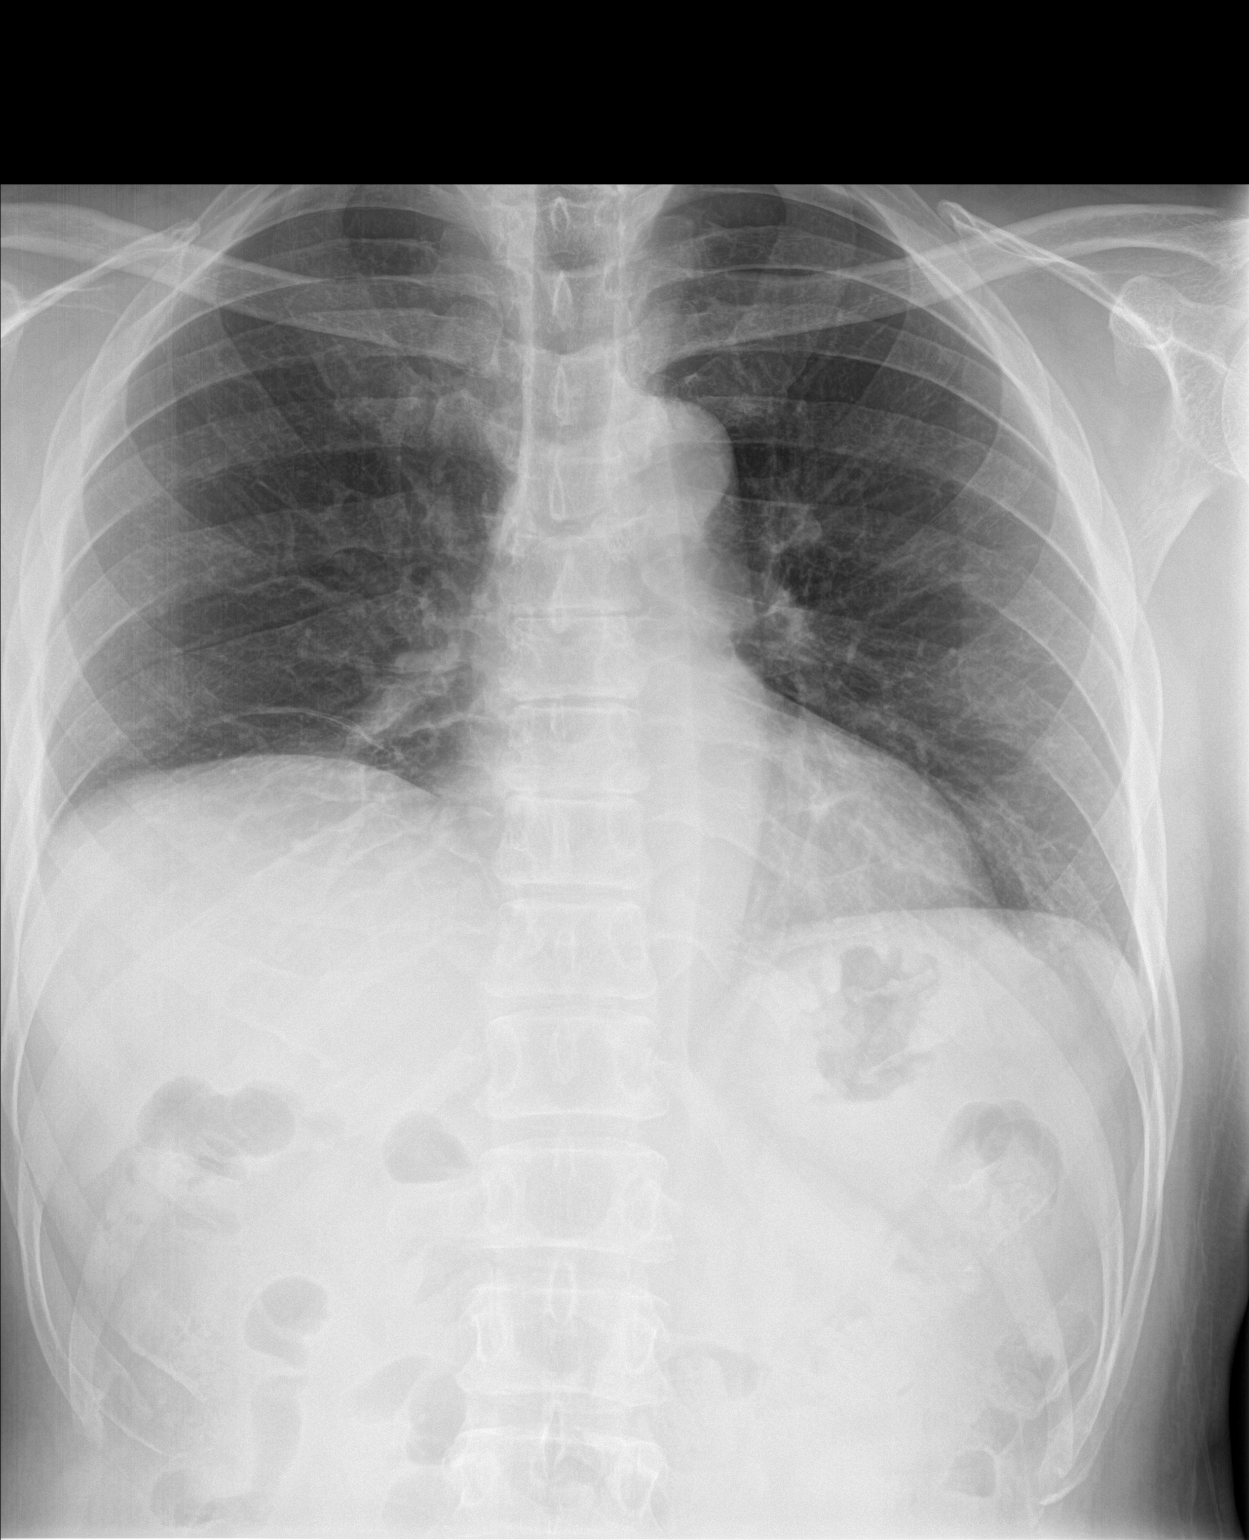

[chest lat]
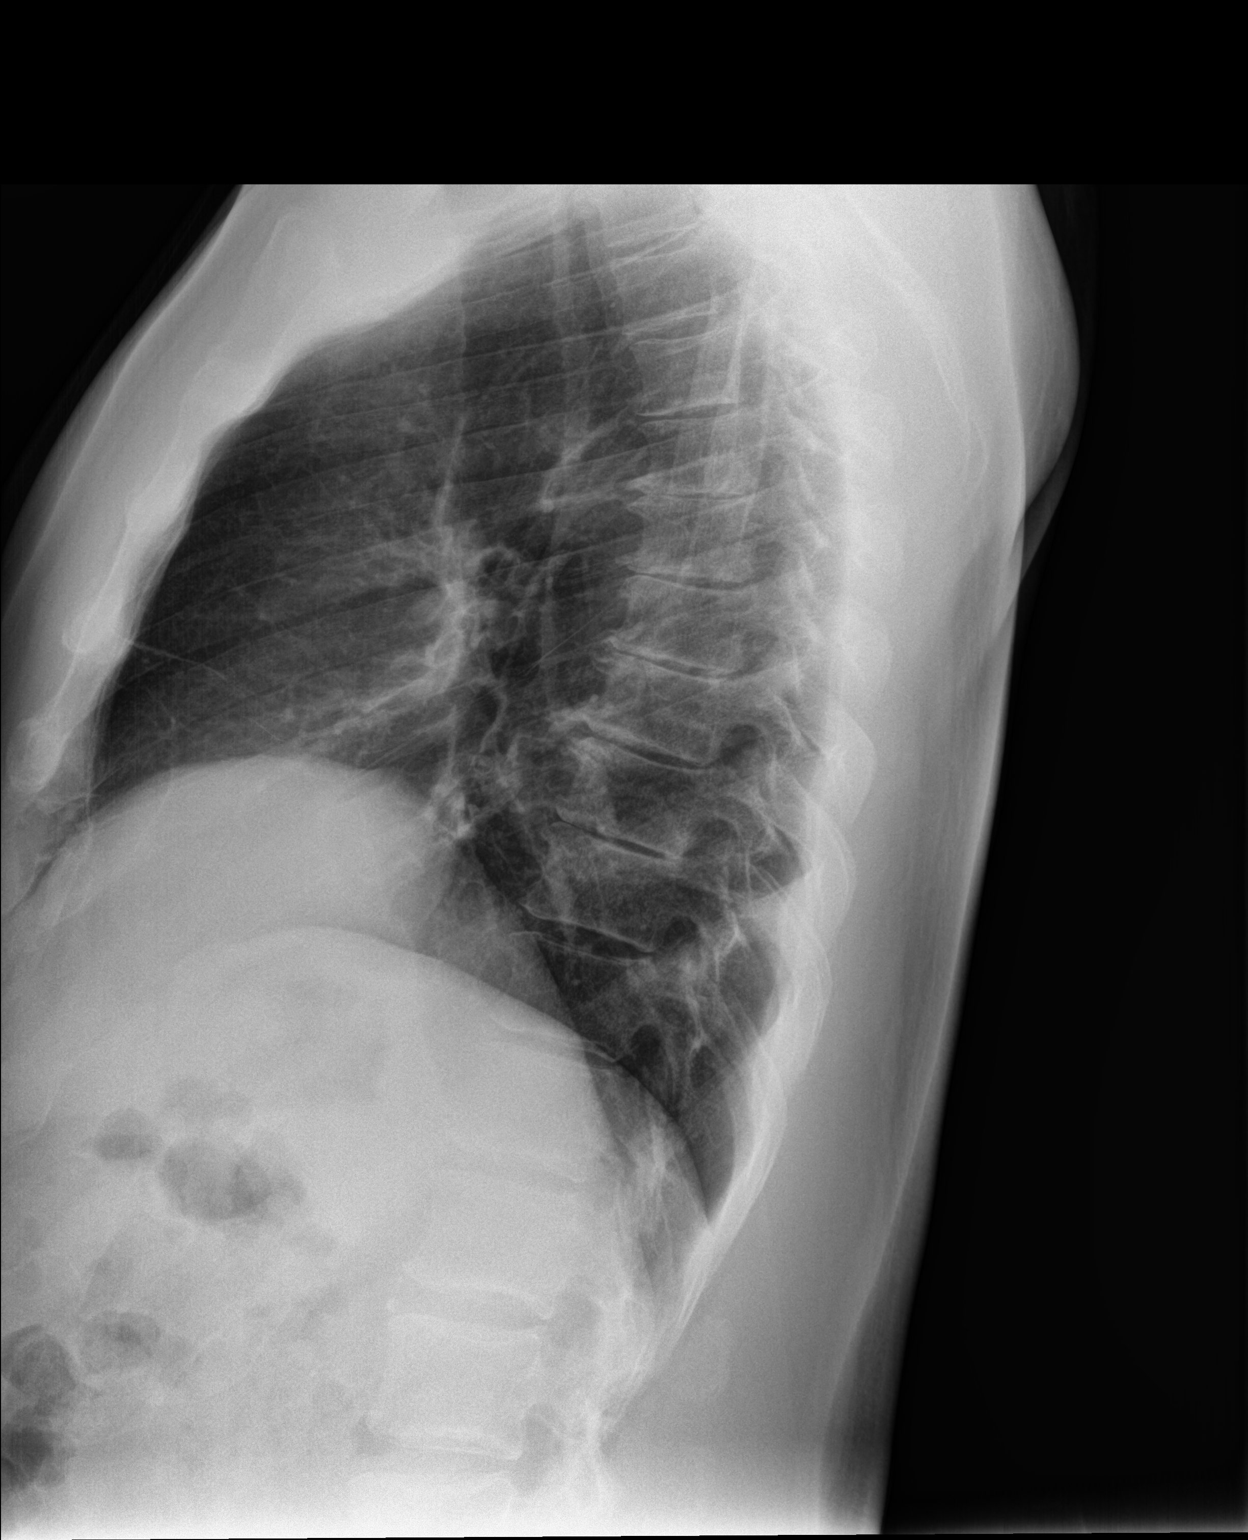

[2 of 2 positions shown; findings below may reference images not displayed]

FINDINGS: The cardiac silhouette, mediastinal and hilar contours are within
normal limits and stable. Mild tortuosity of the thoracic aorta.
Minimal streaky right basilar atelectasis and eventration of right
hemidiaphragm. No infiltrates, effusions or edema. The bony thorax
is intact.
IMPRESSION: No acute cardiopulmonary findings.

## 2018-06-15 ENCOUNTER — Ambulatory Visit: Payer: Self-pay | Admitting: Family Medicine

## 2018-07-27 ENCOUNTER — Ambulatory Visit: Payer: Federal, State, Local not specified - PPO | Admitting: Family Medicine

## 2018-09-04 ENCOUNTER — Ambulatory Visit (INDEPENDENT_AMBULATORY_CARE_PROVIDER_SITE_OTHER): Payer: Federal, State, Local not specified - PPO | Admitting: Family Medicine

## 2018-09-04 VITALS — BP 147/73 | HR 71 | Temp 98.0°F | Resp 16 | Ht 70.0 in | Wt 179.0 lb

## 2018-09-04 DIAGNOSIS — R7989 Other specified abnormal findings of blood chemistry: Secondary | ICD-10-CM

## 2018-09-04 DIAGNOSIS — R634 Abnormal weight loss: Secondary | ICD-10-CM

## 2018-09-04 DIAGNOSIS — E785 Hyperlipidemia, unspecified: Secondary | ICD-10-CM

## 2018-09-04 NOTE — Progress Notes (Signed)
Subjective:    Patient ID: Andrew Wolf, male    DOB: 1946-03-02, 72 y.o.   MRN: [id number]  HPI Andrew Wolf is a 72 y.o. male Presents today for: Chief Complaint  Patient presents with  . Weight Loss    follow up/ 3 month check on cholestrol   Mr. Andrew Wolf was seen for a wellness visit in July and at that time he reported losing weight over the prior 8 months.  Had noted previous weight of 183 pounds, 182 pounds in October 2017.  Previous hypothyroidism and low-dose medication without recent meds at that time.  He was up-to-date with colonoscopy in 2017.  Did note that July visit that he was eating breakfast and dinner only since retiring.  TSH was 5.68 on July 16.  Repeated and still borderline late July but normal free T3, normal free T4.  PSA normal at 1.5 on July 16, but prostate exam was noted to have a firm prostate.  Was referred to urology.  40 g prostate, had prostate biopsy that were ok.   Started on Alphablocker for lower urinary tract infection symptoms when seen October 29th.   Feels ok. Eating 3 meals per day. Notices weight has increased on home scales. No temp intolerance changes. No skin or hair changes. No heart palpitations.  Last po intake 7 hrs ago.   Lab Results  Component Value Date   TSH 4.680 (H) 04/28/2018   Wt Readings from Last 3 Encounters:  09/04/18 179 lb (81.2 kg)  05/12/18 182 lb (82.6 kg)  05/04/18 178 lb 12.8 oz (81.1 kg)     Hyperlipidemia: ASCVD risk was discussed late July.  Plans on changing diet including avoiding fried food at that time with repeat testing in 3 to 6 months.  Decided to hold on statin at that time.  Has tried to adjust diet. Cutting eggs back in diet. Exercise about the same. .   Lab Results  Component Value Date   CHOL 199 04/14/2018   HDL 51 04/14/2018   LDLCALC 117 (H) 04/14/2018   TRIG 157 (H) 04/14/2018   CHOLHDL 3.9 04/14/2018     Patient Active Problem List   Diagnosis Date Noted  . GERD  (gastroesophageal reflux disease) 02/11/2013   Past Medical History:  Diagnosis Date  . Allergy   . Anxiety   . Depression   . GERD (gastroesophageal reflux disease)    Past Surgical History:  Procedure Laterality Date  . KNEE SURGERY  2004   No Known Allergies Prior to Admission medications   Medication Sig Start Date End Date Taking? Authorizing Provider  Cholecalciferol (VITAMIN D-3 PO) Take by mouth.    [provider]  cyclobenzaprine (FLEXERIL) 5 MG tablet Take 1 tablet (5 mg total) by mouth 3 (three) times daily as needed for muscle spasms (start qhs prn due to sedation). 05/04/18   [name], MD  esomeprazole (NEXIUM) 40 MG capsule Take 40 mg by mouth 2 (two) times daily.    [provider]  traMADol (ULTRAM) 50 MG tablet Take 1 tablet (50 mg total) by mouth every 6 (six) hours as needed. 05/04/18   [name], MD   Social History   Socioeconomic History  . Marital status: Married    Spouse name: Not on file  . Number of children: Not on file  . Years of education: Not on file  . Highest education level: Not on file  Occupational History  . Not on file  Social Needs  . Financial resource strain: Not on file  . Food insecurity:    Worry: Not on file    Inability: Not on file  . Transportation needs:    Medical: Not on file    Non-medical: Not on file  Tobacco Use  . Smoking status: Passive Smoke Exposure - Never Smoker  . Smokeless tobacco: Never Used  Substance and Sexual Activity  . Alcohol use: Yes    Alcohol/week: 1.0 standard drinks    Types: 1 Glasses of wine per week  . Drug use: No  . Sexual activity: Yes  Lifestyle  . Physical activity:    Days per week: Not on file    Minutes per session: Not on file  . Stress: Not on file  Relationships  . Social connections:    Talks on phone: Not on file    Gets together: Not on file    Attends religious service: Not on file    Active member of club or organization: Not on file     Attends meetings of clubs or organizations: Not on file    Relationship status: Not on file  . Intimate partner violence:    Fear of current or ex partner: Not on file    Emotionally abused: Not on file    Physically abused: Not on file    Forced sexual activity: Not on file  Other Topics Concern  . Not on file  Social History Narrative  . Not on file    Review of Systems  Constitutional: Negative for diaphoresis, fever and unexpected weight change.  Gastrointestinal: Negative for blood in stool.  Endocrine: Negative for cold intolerance and heat intolerance.       No new temp intolerance.    Per HPI.      Objective:   Physical Exam  Constitutional: He is oriented to person, place, and time. He appears well-developed and well-nourished.  HENT:  Head: Normocephalic and atraumatic.  Eyes: Pupils are equal, round, and reactive to light. EOM are normal.  Neck: Neck supple. No JVD present. Carotid bruit is not present. No thyromegaly present.  Cardiovascular: Normal rate, regular rhythm and normal heart sounds.  No murmur heard. Pulmonary/Chest: Effort normal and breath sounds normal. He has no rales.  Musculoskeletal: He exhibits tenderness. He exhibits no edema.  Neurological: He is alert and oriented to person, place, and time.  Skin: Skin is warm and dry.  Psychiatric: He has a normal mood and affect.  Vitals reviewed.  Vitals:   09/04/18 1446  BP: (!) 147/73  Pulse: 71  Resp: 16  Temp: 98 F (36.7 C)  TempSrc: Oral  SpO2: 99%  Weight: 179 lb (81.2 kg)  Height: 5\' 10"  (1.778 m)      Assessment & Plan:  Andrew Wolf is a 72 y.o. male Weight loss  -Stable reports some weight gain.  Prostate testing reassuring as above.  Continue to monitor  Elevated TSH - Plan: TSH + free T4  -Repeat testing, possible subclinical hypothyroidism, but only mildly elevated TSH.  Hyperlipidemia, unspecified hyperlipidemia type - Plan: Lipid panel  -Recheck cholesterol,  currently not on meds.  Determine need based on results  No orders of the defined types were placed in this encounter.  Patient Instructions    I will check some lab work from today.  Follow-up in 6 months.  Thanks for coming in today.    If you have lab work done today you will be contacted with your  lab results within the next 2 weeks.  If you have not heard from [place] then please contact us. The fastest way to get your results is to register for My Chart.   IF you received an x-ray today, you will receive an invoice from [facility]. Please contact [facility] at [phone] with questions or concerns regarding your invoice.   IF you received labwork today, you will receive an invoice from [facility]. Please contact [facility] at [phone] with questions or concerns regarding your invoice.   Our billing staff will not be able to assist you with questions regarding bills from these companies.  You will be contacted with the lab results as soon as they are available. The fastest way to get your results is to activate your My Chart account. Instructions are located on the last page of this paperwork. If you have not heard from [place] regarding the results in 2 weeks, please contact this office.       Signed,   [name], MD Primary Care at [facility].  09/06/18 2:06 PM

## 2018-09-04 NOTE — Patient Instructions (Addendum)
  I will check some lab work from today.  Follow-up in 6 months.  Thanks for coming in today.    If you have lab work done today you will be contacted with your lab results within the next 2 weeks.  If you have not heard from us then please contact us. The fastest way to get your results is to register for My Chart.   IF you received an x-ray today, you will receive an invoice from [facility]. Please contact [facility] at [phone] with questions or concerns regarding your invoice.   IF you received labwork today, you will receive an invoice from [facility]. Please contact [facility] at [phone] with questions or concerns regarding your invoice.   Our billing staff will not be able to assist you with questions regarding bills from these companies.  You will be contacted with the lab results as soon as they are available. The fastest way to get your results is to activate your My Chart account. Instructions are located on the last page of this paperwork. If you have not heard from us regarding the results in 2 weeks, please contact this office.

## 2018-09-05 LAB — LIPID PANEL
CHOL/HDL RATIO: 3.6 ratio (ref 0.0–5.0)
CHOLESTEROL TOTAL: 188 mg/dL (ref 100–199)
HDL: 52 mg/dL (ref >39)
LDL CALC: 119 mg/dL — AB (ref 0–99)
TRIGLYCERIDES: 87 mg/dL (ref 0–149)
VLDL Cholesterol Cal: 17 mg/dL (ref 5–40)

## 2018-09-05 LAB — TSH+FREE T4
Free T4: 1.42 ng/dL (ref 0.82–1.77)
TSH: 6.7 u[IU]/mL — ABNORMAL HIGH (ref 0.450–4.500)

## 2018-09-06 ENCOUNTER — Encounter: Payer: Self-pay | Admitting: Family Medicine

## 2019-03-04 ENCOUNTER — Ambulatory Visit: Payer: Federal, State, Local not specified - PPO | Admitting: Family Medicine

## 2019-03-05 ENCOUNTER — Telehealth: Payer: Self-pay | Admitting: Family Medicine

## 2019-03-05 NOTE — Telephone Encounter (Signed)
03/05/2019 - PATIENT HAS AN APPOINTMENT ON Monday (03/08/2019) TO SEE DR. GREENE AT 1:20pm. DR. Neva Seat HAS OK'ed ME TO CALL HIM TO LET HIM KNOW THAT HE DOES NOT NEED TO COME INTO THE OFFICE. DR. Neva Seat WILL DO A TELEMED. DR. Neva Seat DOES WANT [place] TO ASK Andrew Wolf IF HE HAS A SCALE AT HOME THAT HE CAN CHECK HIS WEIGHT. I HAVE ASKED HIM TO CALL us BACK TODAY SO THAT WE CAN CONFIRM HE DID GET THIS MESSAGE. MBC

## 2019-03-08 ENCOUNTER — Other Ambulatory Visit: Payer: Self-pay

## 2019-03-08 ENCOUNTER — Telehealth (INDEPENDENT_AMBULATORY_CARE_PROVIDER_SITE_OTHER): Payer: Federal, State, Local not specified - PPO | Admitting: Family Medicine

## 2019-03-08 DIAGNOSIS — B009 Herpesviral infection, unspecified: Secondary | ICD-10-CM

## 2019-03-08 DIAGNOSIS — R634 Abnormal weight loss: Secondary | ICD-10-CM

## 2019-03-08 DIAGNOSIS — E785 Hyperlipidemia, unspecified: Secondary | ICD-10-CM | POA: Diagnosis not present

## 2019-03-08 DIAGNOSIS — R7989 Other specified abnormal findings of blood chemistry: Secondary | ICD-10-CM

## 2019-03-08 MED ORDER — VALACYCLOVIR HCL 1 G PO TABS: ORAL_TABLET | ORAL | 3 refills | Status: DC

## 2019-03-08 NOTE — Progress Notes (Signed)
CC- 6 mo F/u labs and weight- Patient stated he is doing fine. Patient need labs but have not been fasting today. Patient states you have been just monitoring his cholesterol. Will need to schedule appt for labs. Patient weight this morning was 180.0. Patient also need a refill on valtrex for skin lesion.

## 2019-03-08 NOTE — Patient Instructions (Addendum)
  I will check thyroid test again, and decide if meds needed. Keep montioring weight and if any changes, let me know. Recheck in 6 months.   I will check cholesterol level on upcoming labs. Increase walking can help as form of exercise. I will let   Valtrex refilled for cold sore. If those are more frequent, can discuss daily medication for prevention.     If you have lab work done today you will be contacted with your lab results within the next 2 weeks.  If you have not heard from [place] then please contact us. The fastest way to get your results is to register for My Chart.   IF you received an x-ray today, you will receive an invoice from [facility]. Please contact [facility] at [phone] with questions or concerns regarding your invoice.   IF you received labwork today, you will receive an invoice from [facility]. Please contact [facility] at [phone] with questions or concerns regarding your invoice.   Our billing staff will not be able to assist you with questions regarding bills from these companies.  You will be contacted with the lab results as soon as they are available. The fastest way to get your results is to activate your My Chart account. Instructions are located on the last page of this paperwork. If you have not heard from [place] regarding the results in 2 weeks, please contact this office.

## 2019-03-08 NOTE — Progress Notes (Signed)
Virtual Visit via Telephone Note  I connected with Andrew Wolf on 03/08/19 at 1:36 PM by telephone and verified that I am speaking with the correct person using two identifiers.   I discussed the limitations, risks, security and privacy concerns of performing an evaluation and management service by telephone and the availability of in person appointments. I also discussed with the patient that there may be a patient responsible charge related to this service. The patient expressed understanding and agreed to proceed, consent obtained  Chief complaint:  Wt loss.   History of Present Illness: Andrew Wolf is a 73 y.o. male  Weight loss: Discussed initially in July of last year.  Borderline TSH last July.  PSA 1.5 last July, but questionable prostate exam.  Refer to urology, with reported negative biopsies.  When seen in December weight had improved on home scales, no temp intolerance changes skin or hair changes and no heart palpitations.  Still borderline TSH is 6.7, up from 4.6 at that time but normal free T4.  Wt Readings from Last 3 Encounters:  09/04/18 179 lb (81.2 kg)  05/12/18 182 lb (82.6 kg)  05/04/18 178 lb 12.8 oz (81.1 kg)   Home weight 180 today.  Feels like weight is stable.  Denies new heart palpitations, no new hair/skin changes, no temp intolerance.  Hyperlipidemia:  Lab Results  Component Value Date   CHOL 188 09/04/2018   HDL 52 09/04/2018   LDLCALC 119 (H) 09/04/2018   TRIG 87 09/04/2018   CHOLHDL 3.6 09/04/2018   Lab Results  Component Value Date   ALT 13 04/14/2018   AST 20 04/14/2018   ALKPHOS 95 04/14/2018   BILITOT 0.4 04/14/2018  The 10-year ASCVD risk score Denman George(Goff DC Jr., et al., 2013) is: 15.4%   Values used to calculate the score:     Age: [age] years     Sex: Male     Is Non-Hispanic African American: Yes     Diabetic: No     Tobacco smoker: No     Systolic Blood Pressure: 147 mmHg     Is BP treated: No     HDL Cholesterol: 52 mg/dL      Total Cholesterol: 188 mg/dL Mild elevated LDL previously.  Not on statin. No regular exercise - just work around the house. 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  Last flare 3 weeks ago, and about 2 months ago. Will subside for 9 months or so.  Tolerating valtrex.       Patient Active Problem List   Diagnosis Date Noted  . GERD (gastroesophageal reflux disease) 02/11/2013   Past Medical History:  Diagnosis Date  . Allergy   . Anxiety   . Depression   . GERD (gastroesophageal reflux disease)    Past Surgical History:  Procedure Laterality Date  . KNEE SURGERY  2004   No Known Allergies Prior to Admission medications   Medication Sig Start Date End Date Taking? Authorizing Provider  Cholecalciferol (VITAMIN D-3 PO) Take by mouth.   Yes [provider]  esomeprazole (NEXIUM) 40 MG capsule Take 40 mg by mouth 2 (two) times daily.   Yes [provider]  tamsulosin (FLOMAX) 0.4 MG CAPS capsule Take 0.4 mg by mouth.   Yes [provider]   Social History   Socioeconomic History  . Marital status: Married    Spouse name: Not on file  . Number  of children: Not on file  . Years of education: Not on file  . Highest education level: Not on file  Occupational History  . Not on file  Social Needs  . Financial resource strain: Not on file  . Food insecurity:    Worry: Not on file    Inability: Not on file  . Transportation needs:    Medical: Not on file    Non-medical: Not on file  Tobacco Use  . Smoking status: Passive Smoke Exposure - Never Smoker  . Smokeless tobacco: Never Used  Substance and Sexual Activity  . Alcohol use: Yes    Alcohol/week: 1.0 standard drinks    Types: 1 Glasses of wine per week  . Drug use: No  . Sexual activity: Yes  Lifestyle  . Physical activity:    Days per week: Not on file    Minutes per session: Not on file  . Stress: Not on file  Relationships  . Social  connections:    Talks on phone: Not on file    Gets together: Not on file    Attends religious service: Not on file    Active member of club or organization: Not on file    Attends meetings of clubs or organizations: Not on file    Relationship status: Not on file  . Intimate partner violence:    Fear of current or ex partner: Not on file    Emotionally abused: Not on file    Physically abused: Not on file    Forced sexual activity: Not on file  Other Topics Concern  . Not on file  Social History Narrative  . Not on file     Observations/Objective: Wt Readings from Last 3 Encounters:  09/04/18 179 lb (81.2 kg)  05/12/18 182 lb (82.6 kg)  05/04/18 178 lb 12.8 oz (81.1 kg)  Reported home weight 180 this morning. No distress on phone, appropriate responses.  Understanding expressed of plan, all questions answered.  Assessment and Plan: Hyperlipidemia, unspecified hyperlipidemia type - Plan: Lipid Panel  -Slight elevation previously.  Repeat testing at upcoming lab visit.  Walking for exercise/low intensity exercise discussed.  Can discuss statin need based on labs/ASCVD risk  HSV-1 (herpes simplex virus 1) infection - Plan: valACYclovir (VALTREX) 1000 MG tablet  -Episodic flares, not at level of daily prophylaxis at this point, but option was discussed.  Refilled Valtrex for 2 g x 1 followed by repeat dose in 12 hours at onset of symptoms.  Loss of weight - Plan: TSH + free T4  -Weight stable, borderline elevated TSH previously.  Repeat TSH with free T4.  Recheck 6 months  Follow Up Instructions: 6months.   Patient Instructions    I will check thyroid test again, and decide if meds needed. Keep montioring weight and if any changes, let me know. Recheck in 6 months.   I will check cholesterol level on upcoming labs. Increase walking can help as form of exercise. I will let   Valtrex refilled for cold sore. If those are more frequent, can discuss daily medication for  prevention.     If you have lab work done today you will be contacted with your lab results within the next 2 weeks.  If you have not heard from us then please contact us. The fastest way to get your results is to register for My Chart.   IF you received an x-ray today, you will receive an invoice [facility]. Please contact [facility]  at [phone] with questions or concerns regarding your invoice.   IF you received labwork today, you will receive an invoice from [facility]. Please contact [facility] at [phone] with questions or concerns regarding your invoice.   Our billing staff will not be able to assist you with questions regarding bills from these companies.  You will be contacted with the lab results as soon as they are available. The fastest way to get your results is to activate your My Chart account. Instructions are located on the last page of this paperwork. If you have not heard from [place] regarding the results in 2 weeks, please contact this office.          I discussed the assessment and treatment plan with the patient. The patient was provided an opportunity to ask questions and all were answered. The patient agreed with the plan and demonstrated an understanding of the instructions.   The patient was advised to call back or seek an in-person evaluation if the symptoms worsen or if the condition fails to improve as anticipated.  I provided 9 minutes of non-face-to-face time during this encounter.  Signed,   Merri Ray, MD [facility].  03/08/19

## 2019-03-09 ENCOUNTER — Other Ambulatory Visit: Payer: Self-pay

## 2019-03-09 ENCOUNTER — Ambulatory Visit (INDEPENDENT_AMBULATORY_CARE_PROVIDER_SITE_OTHER): Payer: Federal, State, Local not specified - PPO | Admitting: Family Medicine

## 2019-03-09 DIAGNOSIS — E785 Hyperlipidemia, unspecified: Secondary | ICD-10-CM

## 2019-03-09 DIAGNOSIS — R7989 Other specified abnormal findings of blood chemistry: Secondary | ICD-10-CM | POA: Diagnosis not present

## 2019-03-09 DIAGNOSIS — R634 Abnormal weight loss: Secondary | ICD-10-CM

## 2019-03-10 LAB — TSH+FREE T4
Free T4: 1.32 ng/dL (ref 0.82–1.77)
TSH: 5.31 u[IU]/mL — ABNORMAL HIGH (ref 0.450–4.500)

## 2019-03-10 LAB — LIPID PANEL
Chol/HDL Ratio: 4.1 ratio (ref 0.0–5.0)
Cholesterol, Total: 199 mg/dL (ref 100–199)
HDL: 49 mg/dL (ref >39)
LDL Calculated: 126 mg/dL — ABNORMAL HIGH (ref 0–99)
Triglycerides: 122 mg/dL (ref 0–149)
VLDL Cholesterol Cal: 24 mg/dL (ref 5–40)

## 2019-09-08 ENCOUNTER — Ambulatory Visit: Payer: Federal, State, Local not specified - PPO | Admitting: Family Medicine

## 2019-09-16 ENCOUNTER — Other Ambulatory Visit: Payer: Self-pay

## 2019-09-16 ENCOUNTER — Encounter: Payer: Self-pay | Admitting: Family Medicine

## 2019-09-16 ENCOUNTER — Ambulatory Visit (INDEPENDENT_AMBULATORY_CARE_PROVIDER_SITE_OTHER): Payer: Federal, State, Local not specified - PPO | Admitting: Family Medicine

## 2019-09-16 VITALS — BP 133/83 | HR 63 | Temp 97.6°F | Wt 178.4 lb

## 2019-09-16 DIAGNOSIS — R7989 Other specified abnormal findings of blood chemistry: Secondary | ICD-10-CM

## 2019-09-16 DIAGNOSIS — L309 Dermatitis, unspecified: Secondary | ICD-10-CM

## 2019-09-16 DIAGNOSIS — R12 Heartburn: Secondary | ICD-10-CM

## 2019-09-16 DIAGNOSIS — E785 Hyperlipidemia, unspecified: Secondary | ICD-10-CM | POA: Diagnosis not present

## 2019-09-16 MED ORDER — TRIAMCINOLONE ACETONIDE 0.1 % EX CREA: 1.0000 "application " | TOPICAL_CREAM | Freq: Two times a day (BID) | CUTANEOUS | 2 refills | Status: DC

## 2019-09-16 NOTE — Patient Instructions (Addendum)
Try steroid cream to the dry itchy areas of your hand up to twice per day.  Eucerin or Aveeno lotion 2-3 times per day to dry skin .  If not improving in the next few weeks let me know as a stronger steroid cream may be needed.  No other medication changes for now.   Eczema Eczema is a broad term for a group of skin conditions that cause skin to become rough and inflamed. Each type of eczema has different triggers, symptoms, and treatments. Eczema of any type is usually itchy and symptoms range from mild to severe. Eczema and its symptoms are not spread from person to person (are not contagious). It can appear on different parts of the body at different times. Your eczema may not look the same as someone else's eczema. What are the types of eczema? Atopic dermatitis This is a long-term (chronic) skin disease that keeps coming back (recurring). Usual symptoms are dry skin and small, solid pimples that may swell and leak fluid (weep). Contact dermatitis  This happens when something irritates the skin and causes a rash. The irritation can come from substances that you are allergic to (allergens), such as poison ivy, chemicals, or medicines that were applied to your skin. Dyshidrotic eczema This is a form of eczema on the hands and feet. It shows up as very itchy, fluid-filled blisters. It can affect people of any age, but is more common before age 79. Hand eczema  This causes very itchy areas of skin on the palms and sides of the hands and fingers. This type of eczema is common in industrial jobs where you may be exposed to many different types of irritants. Lichen simplex chronicus This type of eczema occurs when a person constantly scratches one area of the body. Repeated scratching of the area leads to thickened skin (lichenification). Lichen simplex chronicus can occur along with other types of eczema. It is more common in adults, but may be seen in children as well. Nummular eczema This is a  common type of eczema. It has no known cause. It typically causes a red, circular, crusty lesion (plaque) that may be itchy. Scratching may become a habit and can cause bleeding. Nummular eczema occurs most often in people of middle-age or older. It most often affects the hands. Seborrheic dermatitis This is a common skin disease that mainly affects the scalp. It may also affect any oily areas of the body, such as the face, sides of nose, eyebrows, ears, eyelids, and chest. It is marked by small scaling and redness of the skin (erythema). This can affect people of all ages. In infants, this condition is known as Chartered certified accountant." Stasis dermatitis This is a common skin disease that usually appears on the legs and feet. It most often occurs in people who have a condition that prevents blood from being pumped through the veins in the legs (chronic venous insufficiency). Stasis dermatitis is a chronic condition that needs long-term management. How is eczema diagnosed? Your health care provider will examine your skin and review your medical history. He or she may also give you skin patch tests. These tests involve taking patches that contain possible allergens and placing them on your back. He or she will then check in a few days to see if an allergic reaction occurred. What are the common treatments? Treatment for eczema is based on the type of eczema you have. Hydrocortisone steroid medicine can relieve itching quickly and help reduce inflammation. This medicine may  be prescribed or obtained over-the-counter, depending on the strength of the medicine that is needed. Follow these instructions at home:  Take over-the-counter and prescription medicines only as told by your health care provider.  Use creams or ointments to moisturize your skin. Do not use lotions.  Learn what triggers or irritates your symptoms. Avoid these things.  Treat symptom flare-ups quickly.  Do not itch your skin. This can make your  rash worse.  Keep all follow-up visits as told by your health care provider. This is important. Where to find more information  The American Academy of Dermatology: InfoExam.si  The National Eczema Association: www.nationaleczema.org Contact a health care provider if:  You have serious itching, even with treatment.  You regularly scratch your skin until it bleeds.  Your rash looks different than usual.  Your skin is painful, swollen, or more red than usual.  You have a fever. Summary  There are eight general types of eczema. Each type has different triggers.  Eczema of any type causes itching that may range from mild to severe.  Treatment varies based on the type of eczema you have. Hydrocortisone steroid medicine can help with itching and inflammation.  Protecting your skin is the best way to prevent eczema. Use moisturizers and lotions. Avoid triggers and irritants, and treat flare-ups quickly. This information is not intended to replace advice given to you by your health care provider. Make sure you discuss any questions you have with your health care provider. Document Released: 01/30/2017 Document Revised: 08/29/2017 Document Reviewed: 01/30/2017 Elsevier Patient Education  [employer].    If you have lab work done today you will be contacted with your lab results within the next 2 weeks.  If you have not heard from [place] then please contact us. The fastest way to get your results is to register for My Chart.   IF you received an x-ray today, you will receive an invoice from [facility]. Please contact [facility] at [phone] with questions or concerns regarding your invoice.   IF you received labwork today, you will receive an invoice from [facility]. Please contact [facility] at [phone] with questions or concerns regarding your invoice.   Our billing staff will not be able to assist you with questions regarding bills from these  companies.  You will be contacted with the lab results as soon as they are available. The fastest way to get your results is to activate your My Chart account. Instructions are located on the last page of this paperwork. If you have not heard from [place] regarding the results in 2 weeks, please contact this office.

## 2019-09-16 NOTE — Progress Notes (Signed)
Subjective:  Patient ID: Andrew Wolf, male    DOB: 1946/09/12  Age: 73 y.o. MRN: [id number]  CC:  Chief Complaint  Patient presents with  . Medical Management of Chronic Issues    6 month f/u.   Marland Kitchen Eczema    skin dryness in the right palm of hand for the past year not sure what it is    HPI [employer] presents for    GERD: Takes Nexium 40 mg BID as recommended by GI.   Discussed weight loss in July of last year.  Borderline TSH last July.  PSA 1.5, questionable prostate exam, biopsies reportedly negative with urology.  Will improved on home scales.  Stable discussion June. Wt Readings from Last 3 Encounters:  09/16/19 178 lb 6.4 oz (80.9 kg)  09/04/18 179 lb (81.2 kg)  05/12/18 182 lb (82.6 kg)   Elevated TSH: Borderline prior - 4.6-6.7 past 1.5 yrs. Normal free t4.  Taking medication daily.  No new hot or cold intolerance. No new hair changes (dry skin on feet and hands at times - more hand washing /sanitizer). heart palpitations or new fatigue. No new weight changes.    Hyperlipidemia: Not on medication. No prior statin. Plan on walking/low intensity exercise. The 10-year ASCVD risk score Denman George DC Montez Hageman., et al., 2013) is: 13.5%   Values used to calculate the score:     Age: 48 years     Sex: Male     Is Non-Hispanic African American: Yes     Diabetic: No     Tobacco smoker: No     Systolic Blood Pressure: 133 mmHg     Is BP treated: No     HDL Cholesterol: 49 mg/dL     Total Cholesterol: 199 mg/dL  Lab Results  Component Value Date   CHOL 199 03/09/2019   HDL 49 03/09/2019   LDLCALC 126 (H) 03/09/2019   TRIG 122 03/09/2019   CHOLHDL 4.1 03/09/2019   Lab Results  Component Value Date   ALT 13 04/14/2018   AST 20 04/14/2018   ALKPHOS 95 04/14/2018   BILITOT 0.4 04/14/2018  fasting today.    Dry skin/rash on right hand: Noticed with 1 handwashing with pandemic.  Has applied Neosporin to the areas at times.  No current steroid  use.  History Patient Active Problem List   Diagnosis Date Noted  . GERD (gastroesophageal reflux disease) 02/11/2013   Past Medical History:  Diagnosis Date  . Allergy   . Anxiety   . Depression   . GERD (gastroesophageal reflux disease)    Past Surgical History:  Procedure Laterality Date  . KNEE SURGERY  2004   No Known Allergies Prior to Admission medications   Medication Sig Start Date End Date Taking? Authorizing Provider  Cholecalciferol (VITAMIN D-3 PO) Take by mouth.   Yes [provider]  esomeprazole (NEXIUM) 40 MG capsule Take 40 mg by mouth 2 (two) times daily.   Yes [provider]  tamsulosin (FLOMAX) 0.4 MG CAPS capsule Take 0.4 mg by mouth.   Yes [provider]  valACYclovir (VALTREX) 1000 MG tablet Use 2 tablets initially, then 2 in 12 hours for fever blisters 03/08/19  Yes [name], MD   Social History   Socioeconomic History  . Marital status: Married    Spouse name: Not on file  . Number of children: Not on file  . Years of education: Not on file  . Highest education level: Not on  file  Occupational History  . Not on file  Tobacco Use  . Smoking status: Passive Smoke Exposure - Never Smoker  . Smokeless tobacco: Never Used  Substance and Sexual Activity  . Alcohol use: Yes    Alcohol/week: 1.0 standard drinks    Types: 1 Glasses of wine per week  . Drug use: No  . Sexual activity: Yes  Other Topics Concern  . Not on file  Social History Narrative  . Not on file   Social Determinants of Health   Financial Resource Strain:   . Difficulty of Paying Living Expenses: Not on file  Food Insecurity:   . Worried About Programme researcher, broadcasting/film/video in the Last Year: Not on file  . Ran Out of Food in the Last Year: Not on file  Transportation Needs:   . Lack of Transportation (Medical): Not on file  . Lack of Transportation (Non-Medical): Not on file  Physical Activity:   . Days of Exercise per Week: Not on file  .  Minutes of Exercise per Session: Not on file  Stress:   . Feeling of Stress : Not on file  Social Connections:   . Frequency of Communication with Friends and Family: Not on file  . Frequency of Social Gatherings with Friends and Family: Not on file  . Attends Religious Services: Not on file  . Active Member of Clubs or Organizations: Not on file  . Attends Banker Meetings: Not on file  . Marital Status: Not on file  Intimate Partner Violence:   . Fear of Current or Ex-Partner: Not on file  . Emotionally Abused: Not on file  . Physically Abused: Not on file  . Sexually Abused: Not on file    Review of Systems  Per HPI.  Objective:   Vitals:   09/16/19 1113  BP: 133/83  Pulse: 63  Temp: 97.6 F (36.4 C)  TempSrc: Temporal  SpO2: 98%  Weight: 178 lb 6.4 oz (80.9 kg)     Physical Exam Vitals reviewed.  Constitutional:      Appearance: He is well-developed.  HENT:     Head: Normocephalic and atraumatic.  Eyes:     Pupils: Pupils are equal, round, and reactive to light.  Neck:     Vascular: No carotid bruit or JVD.  Cardiovascular:     Rate and Rhythm: Normal rate and regular rhythm.     Heart sounds: Normal heart sounds. No murmur.  Pulmonary:     Effort: Pulmonary effort is normal.     Breath sounds: Normal breath sounds. No rales.  Skin:    General: Skin is warm and dry.       Neurological:     Mental Status: He is alert and oriented to person, place, and time.     Assessment & Plan:  CLARANCE BOLLARD is a 73 y.o. male . Hand dermatitis - Plan: triamcinolone cream (KENALOG) 0.1 %  -Suspected eczema, triamcinolone twice per day with Eucerin or Aveeno.  May need stronger steroid if not improving.  Other dry skin care reviewed.  Elevated TSH - Plan: TSH  -Reviewed testing.  Borderline previously.  Hyperlipidemia, unspecified hyperlipidemia type - Plan: Comprehensive metabolic panel, Lipid panel  -Repeat testing, slight elevated ASCVD risk for  previously.  Can discuss statin if still elevated.  Heartburn  -stable, Continued on same regimen, also as recommended by gastroenterology.  Meds ordered this encounter  Medications  . triamcinolone cream (KENALOG) 0.1 %  Sig: Apply 1 application topically 2 (two) times daily.    Dispense:  30 g    Refill:  2   Patient Instructions    Try steroid cream to the dry itchy areas of your hand up to twice per day.  Eucerin or Aveeno lotion 2-3 times per day to dry skin .  If not improving in the next few weeks let me know as a stronger steroid cream may be needed.  No other medication changes for now.   Eczema Eczema is a broad term for a group of skin conditions that cause skin to become rough and inflamed. Each type of eczema has different triggers, symptoms, and treatments. Eczema of any type is usually itchy and symptoms range from mild to severe. Eczema and its symptoms are not spread from person to person (are not contagious). It can appear on different parts of the body at different times. Your eczema may not look the same as someone else's eczema. What are the types of eczema? Atopic dermatitis This is a long-term (chronic) skin disease that keeps coming back (recurring). Usual symptoms are dry skin and small, solid pimples that may swell and leak fluid (weep). Contact dermatitis  This happens when something irritates the skin and causes a rash. The irritation can come from substances that you are allergic to (allergens), such as poison ivy, chemicals, or medicines that were applied to your skin. Dyshidrotic eczema This is a form of eczema on the hands and feet. It shows up as very itchy, fluid-filled blisters. It can affect people of any age, but is more common before age 43. Hand eczema  This causes very itchy areas of skin on the palms and sides of the hands and fingers. This type of eczema is common in industrial jobs where you may be exposed to many different types of  irritants. Lichen simplex chronicus This type of eczema occurs when a person constantly scratches one area of the body. Repeated scratching of the area leads to thickened skin (lichenification). Lichen simplex chronicus can occur along with other types of eczema. It is more common in adults, but may be seen in children as well. Nummular eczema This is a common type of eczema. It has no known cause. It typically causes a red, circular, crusty lesion (plaque) that may be itchy. Scratching may become a habit and can cause bleeding. Nummular eczema occurs most often in people of middle-age or older. It most often affects the hands. Seborrheic dermatitis This is a common skin disease that mainly affects the scalp. It may also affect any oily areas of the body, such as the face, sides of nose, eyebrows, ears, eyelids, and chest. It is marked by small scaling and redness of the skin (erythema). This can affect people of all ages. In infants, this condition is known as Chartered certified accountant." Stasis dermatitis This is a common skin disease that usually appears on the legs and feet. It most often occurs in people who have a condition that prevents blood from being pumped through the veins in the legs (chronic venous insufficiency). Stasis dermatitis is a chronic condition that needs long-term management. How is eczema diagnosed? Your health care provider will examine your skin and review your medical history. He or she may also give you skin patch tests. These tests involve taking patches that contain possible allergens and placing them on your back. He or she will then check in a few days to see if an allergic reaction occurred. What are  the common treatments? Treatment for eczema is based on the type of eczema you have. Hydrocortisone steroid medicine can relieve itching quickly and help reduce inflammation. This medicine may be prescribed or obtained over-the-counter, depending on the strength of the medicine that is  needed. Follow these instructions at home:  Take over-the-counter and prescription medicines only as told by your health care provider.  Use creams or ointments to moisturize your skin. Do not use lotions.  Learn what triggers or irritates your symptoms. Avoid these things.  Treat symptom flare-ups quickly.  Do not itch your skin. This can make your rash worse.  Keep all follow-up visits as told by your health care provider. This is important. Where to find more information  The American Academy of Dermatology: InfoExam.siwww.aad.org  The National Eczema Association: www.nationaleczema.org Contact a health care provider if:  You have serious itching, even with treatment.  You regularly scratch your skin until it bleeds.  Your rash looks different than usual.  Your skin is painful, swollen, or more red than usual.  You have a fever. Summary  There are eight general types of eczema. Each type has different triggers.  Eczema of any type causes itching that may range from mild to severe.  Treatment varies based on the type of eczema you have. Hydrocortisone steroid medicine can help with itching and inflammation.  Protecting your skin is the best way to prevent eczema. Use moisturizers and lotions. Avoid triggers and irritants, and treat flare-ups quickly. This information is not intended to replace advice given to you by your health care provider. Make sure you discuss any questions you have with your health care provider. Document Released: 01/30/2017 Document Revised: 08/29/2017 Document Reviewed: 01/30/2017 Elsevier Patient Education  [employer]2020 Elsevier Inc.    If you have lab work done today you will be contacted with your lab results within the next 2 weeks.  If you have not heard from us then please contact us. The fastest way to get your results is to register for My Chart.   IF you received an x-ray today, you will receive an invoice from [facility]. Please contact  [facility] at [phone] with questions or concerns regarding your invoice.   IF you received labwork today, you will receive an invoice from [facility]. Please contact [facility] at [phone] with questions or concerns regarding your invoice.   Our billing staff will not be able to assist you with questions regarding bills from these companies.  You will be contacted with the lab results as soon as they are available. The fastest way to get your results is to activate your My Chart account. Instructions are located on the last page of this paperwork. If you have not heard from us regarding the results in 2 weeks, please contact this office.          Signed, Meredith StaggersJeffrey Usiel Astarita, MD [facility]

## 2019-09-17 LAB — LIPID PANEL
Chol/HDL Ratio: 4.2 ratio (ref 0.0–5.0)
Cholesterol, Total: 208 mg/dL — ABNORMAL HIGH (ref 100–199)
HDL: 49 mg/dL (ref >39)
LDL Chol Calc (NIH): 132 mg/dL — ABNORMAL HIGH (ref 0–99)
Triglycerides: 149 mg/dL (ref 0–149)
VLDL Cholesterol Cal: 27 mg/dL (ref 5–40)

## 2019-09-17 LAB — COMPREHENSIVE METABOLIC PANEL
ALT: 15 IU/L (ref 0–44)
AST: 19 IU/L (ref 0–40)
Albumin/Globulin Ratio: 1.5 (ref 1.2–2.2)
Albumin: 4.4 g/dL (ref 3.7–4.7)
Alkaline Phosphatase: 118 IU/L — ABNORMAL HIGH (ref 39–117)
BUN/Creatinine Ratio: 11 (ref 10–24)
BUN: 14 mg/dL (ref 8–27)
Bilirubin Total: 0.4 mg/dL (ref 0.0–1.2)
CO2: 19 mmol/L — ABNORMAL LOW (ref 20–29)
Calcium: 9.6 mg/dL (ref 8.6–10.2)
Chloride: 101 mmol/L (ref 96–106)
Creatinine, Ser: 1.26 mg/dL (ref 0.76–1.27)
GFR calc Af Amer: 65 mL/min/{1.73_m2} (ref >59)
GFR calc non Af Amer: 57 mL/min/{1.73_m2} — ABNORMAL LOW (ref >59)
Globulin, Total: 2.9 g/dL (ref 1.5–4.5)
Glucose: 84 mg/dL (ref 65–99)
Potassium: 5.1 mmol/L (ref 3.5–5.2)
Sodium: 139 mmol/L (ref 134–144)
Total Protein: 7.3 g/dL (ref 6.0–8.5)

## 2019-09-17 LAB — TSH: TSH: 6.48 u[IU]/mL — ABNORMAL HIGH (ref 0.450–4.500)

## 2019-09-23 ENCOUNTER — Encounter: Payer: Self-pay | Admitting: Family Medicine

## 2019-09-23 DIAGNOSIS — E785 Hyperlipidemia, unspecified: Secondary | ICD-10-CM

## 2019-09-27 MED ORDER — ATORVASTATIN CALCIUM 10 MG PO TABS: 10.0000 mg | ORAL_TABLET | Freq: Every day | ORAL | 0 refills | Status: DC

## 2019-11-05 ENCOUNTER — Telehealth (INDEPENDENT_AMBULATORY_CARE_PROVIDER_SITE_OTHER): Payer: Federal, State, Local not specified - PPO | Admitting: Family Medicine

## 2019-11-05 ENCOUNTER — Other Ambulatory Visit: Payer: Self-pay

## 2019-11-05 VITALS — BP 123/80 | HR 60 | Temp 97.0°F | Ht 69.0 in | Wt 179.0 lb

## 2019-11-05 DIAGNOSIS — R109 Unspecified abdominal pain: Secondary | ICD-10-CM | POA: Diagnosis not present

## 2019-11-05 DIAGNOSIS — R197 Diarrhea, unspecified: Secondary | ICD-10-CM | POA: Diagnosis not present

## 2019-11-05 NOTE — Progress Notes (Signed)
. Virtual Visit via Video Note  I connected with Andrew Wolf on 11/05/19 at 6:10 PM by a video enabled telemedicine application [employer] and verified that I am speaking with the correct person using two identifiers.   I discussed the limitations, risks, security and privacy concerns of performing an evaluation and management service by telephone and the availability of in person appointments. I also discussed with the patient that there may be a patient responsible charge related to this service. The patient expressed understanding and agreed to proceed, consent obtained  Chief complaint: Chief Complaint  Patient presents with  . Abdominal Pain    started on 11/03/2019. pt stated he is bloated and the pain is coming from the stomach itself. stomach is distoted and pt as started having diarreah. pt has tried peptoebismol which helped a little he has tried imodium today.Marland Kitchen    History of Present Illness: Andrew Wolf is a 74 y.o. male  Abdominal Pain Started 2 days ago. Noticed after dinner. Stomach started to become bloated, sore. Had liquid diarrhea. Took pepto bismol. multiple episodes overnight. Felt better yesterday, felt like eating - ate regular food - stomach felt sore again and diarrhea. Soreness and bloating improves after diarrhea. No fever, no vomiting.  No dizziness/near syncope.  No sick contacts, others at home feel well.  Took immodium AD today - stopped diarrhea.  Last episode of diarrhea this morning.  No bloody diarrhea.  Abdomen feels ok.  Drinking fluids - sprite and water. Saltines and broth tolerated today.  No cough/dyspnea/fever/change in taste/smell, no covid contacts.   Patient Active Problem List   Diagnosis Date Noted  . GERD (gastroesophageal reflux disease) 02/11/2013   Past Medical History:  Diagnosis Date  . Allergy   . Anxiety   . Depression   . GERD (gastroesophageal reflux disease)    Past Surgical History:  Procedure Laterality Date  .  KNEE SURGERY  2004   No Known Allergies Prior to Admission medications   Medication Sig Start Date End Date Taking? Authorizing Provider  atorvastatin (LIPITOR) 10 MG tablet Take 1 tablet (10 mg total) by mouth daily. 09/27/19  Yes [name], MD  Cholecalciferol (VITAMIN D-3 PO) Take by mouth.   Yes [provider]  esomeprazole (NEXIUM) 40 MG capsule Take 40 mg by mouth 2 (two) times daily.   Yes [provider]  triamcinolone cream (KENALOG) 0.1 % Apply 1 application topically 2 (two) times daily. 09/16/19  Yes [name], MD  valACYclovir (VALTREX) 1000 MG tablet Use 2 tablets initially, then 2 in 12 hours for fever blisters 03/08/19  Yes [name], MD  tamsulosin (FLOMAX) 0.4 MG CAPS capsule Take 0.4 mg by mouth.    [provider]   Social History   Socioeconomic History  . Marital status: Married    Spouse name: Not on file  . Number of children: Not on file  . Years of education: Not on file  . Highest education level: Not on file  Occupational History  . Not on file  Tobacco Use  . Smoking status: Passive Smoke Exposure - Never Smoker  . Smokeless tobacco: Never Used  Substance and Sexual Activity  . Alcohol use: Yes    Alcohol/week: 1.0 standard drinks    Types: 1 Glasses of wine per week  . Drug use: No  . Sexual activity: Yes  Other Topics Concern  . Not on file  Social History Narrative  . Not on file  Social Determinants of Health   Financial Resource Strain:   . Difficulty of Paying Living Expenses: Not on file  Food Insecurity:   . Worried About Charity fundraiser in the Last Year: Not on file  . Ran Out of Food in the Last Year: Not on file  Transportation Needs:   . Lack of Transportation (Medical): Not on file  . Lack of Transportation (Non-Medical): Not on file  Physical Activity:   . Days of Exercise per Week: Not on file  . Minutes of Exercise per Session: Not on file  Stress:   . Feeling of  Stress : Not on file  Social Connections:   . Frequency of Communication with Friends and Family: Not on file  . Frequency of Social Gatherings with Friends and Family: Not on file  . Attends Religious Services: Not on file  . Active Member of Clubs or Organizations: Not on file  . Attends Archivist Meetings: Not on file  . Marital Status: Not on file  Intimate Partner Violence:   . Fear of Current or Ex-Partner: Not on file  . Emotionally Abused: Not on file  . Physically Abused: Not on file  . Sexually Abused: Not on file    Observations/Objective: Vitals:   11/05/19 1513  BP: 123/80  Pulse: 60  Temp: (!) 97 F (36.1 C)  TempSrc: Oral  Weight: 179 lb (81.2 kg)  Height: 5\' 9"  (1.753 m)     Assessment and Plan: Diarrhea, unspecified type  Abdominal pain, unspecified abdominal location Suspected viral gastroenteritis, versus foodborne illness.   Some improvement past 48 hours.  Afebrile, abdominal pain improves after diarrhea, no vomiting, unlikely obstruction.  Distention symptoms also improved with diarrhea.  Symptomatic care discussed with fluids, saltine crackers, avoid carbonated beverages for now.  Slow resumption of diet.  Follow-up video visit if not improving in the next 48 to 72 hours, ER/urgent care precautions given.  Unlikely COVID-19 without other symptoms but testing discussed if persistent symptoms or additional symptoms.  Isolation precautions discussed.  Follow Up Instructions: Patient Instructions  Diarrhea/abdominal pain may be due to a virus or foodborne illness.  Okay to continue water, broth, saltine crackers if needed.  Try to avoid carbonated beverages.  Imodium only if needed for more frequent diarrhea.  If any fevers, worsening abdominal pain, lightheadedness or dizziness, be seen in urgent care or ER over the weekend.  If you are not continuing to improve through Monday, can schedule a repeat video visit.  If any new symptoms such as fever,  cough, change in taste or smell, I would recommend COVID-19 testing but less likely at this time.  Let me know if the symptoms occur.  I do recommend trying to isolate as much as possible until your symptoms have improved.  Let me know if there are questions and take care.   Diarrhea, Adult Diarrhea is frequent loose and watery bowel movements. Diarrhea can make you feel weak and cause you to become dehydrated. Dehydration can make you tired and thirsty, cause you to have a dry mouth, and decrease how often you urinate. Diarrhea typically lasts 2-3 days. However, it can last longer if it is a sign of something more serious. It is important to treat your diarrhea as told by your health care provider. Follow these instructions at home: Eating and drinking     Follow these recommendations as told by your health care provider:  Take an oral rehydration solution (ORS). This is an  over-the-counter medicine that helps return your body to its normal balance of nutrients and water. It is found at pharmacies and retail stores.  Drink plenty of fluids, such as water, ice chips, diluted fruit juice, and low-calorie sports drinks. You can drink milk also, if desired.  Avoid drinking fluids that contain a lot of sugar or caffeine, such as energy drinks, sports drinks, and soda.  Eat bland, easy-to-digest foods in small amounts as you are able. These foods include bananas, applesauce, rice, lean meats, toast, and crackers.  Avoid alcohol.  Avoid spicy or fatty foods.  Medicines  Take over-the-counter and prescription medicines only as told by your health care provider.  If you were prescribed an antibiotic medicine, take it as told by your health care provider. Do not stop using the antibiotic even if you start to feel better. General instructions   Wash your hands often using soap and water. If soap and water are not available, use a hand sanitizer. Others in the household should wash their hands  as well. Hands should be washed: ? After using the toilet or changing a diaper. ? Before preparing, cooking, or serving food. ? While caring for a sick person or while visiting someone in a hospital.  Drink enough fluid to keep your urine pale yellow.  Rest at home while you recover.  Watch your condition for any changes.  Take a warm bath to relieve any burning or pain from frequent diarrhea episodes.  Keep all follow-up visits as told by your health care provider. This is important. Contact a health care provider if:  You have a fever.  Your diarrhea gets worse.  You have new symptoms.  You cannot keep fluids down.  You feel light-headed or dizzy.  You have a headache.  You have muscle cramps. Get help right away if:  You have chest pain.  You feel extremely weak or you faint.  You have bloody or black stools or stools that look like tar.  You have severe pain, cramping, or bloating in your abdomen.  You have trouble breathing or you are breathing very quickly.  Your heart is beating very quickly.  Your skin feels cold and clammy.  You feel confused.  You have signs of dehydration, such as: ? Dark urine, very little urine, or no urine. ? Cracked lips. ? Dry mouth. ? Sunken eyes. ? Sleepiness. ? Weakness. Summary  Diarrhea is frequent loose and watery bowel movements. Diarrhea can make you feel weak and cause you to become dehydrated.  Drink enough fluids to keep your urine pale yellow.  Make sure that you wash your hands after using the toilet. If soap and water are not available, use hand sanitizer.  Contact a health care provider if your diarrhea gets worse or you have new symptoms.  Get help right away if you have signs of dehydration. This information is not intended to replace advice given to you by your health care provider. Make sure you discuss any questions you have with your health care provider. Document Revised: 02/02/2019 Document  Reviewed: 02/20/2018 Elsevier Patient Education  2020 [employer].      I discussed the assessment and treatment plan with the patient. The patient was provided an opportunity to ask questions and all were answered. The patient agreed with the plan and demonstrated an understanding of the instructions.   The patient was advised to call back or seek an in-person evaluation if the symptoms worsen or if the condition fails to  improve as anticipated.  I provided 15 minutes of non-face-to-face time during this encounter.   [name], MD

## 2019-11-05 NOTE — Patient Instructions (Signed)
Diarrhea/abdominal pain may be due to a virus or foodborne illness.  Okay to continue water, broth, saltine crackers if needed.  Try to avoid carbonated beverages.  Imodium only if needed for more frequent diarrhea.  If any fevers, worsening abdominal pain, lightheadedness or dizziness, be seen in urgent care or ER over the weekend.  If you are not continuing to improve through Monday, can schedule a repeat video visit.  If any new symptoms such as fever, cough, change in taste or smell, I would recommend COVID-19 testing but less likely at this time.  Let me know if the symptoms occur.  I do recommend trying to isolate as much as possible until your symptoms have improved.  Let me know if there are questions and take care.   Diarrhea, Adult Diarrhea is frequent loose and watery bowel movements. Diarrhea can make you feel weak and cause you to become dehydrated. Dehydration can make you tired and thirsty, cause you to have a dry mouth, and decrease how often you urinate. Diarrhea typically lasts 2-3 days. However, it can last longer if it is a sign of something more serious. It is important to treat your diarrhea as told by your health care provider. Follow these instructions at home: Eating and drinking     Follow these recommendations as told by your health care provider:  Take an oral rehydration solution (ORS). This is an over-the-counter medicine that helps return your body to its normal balance of nutrients and water. It is found at pharmacies and retail stores.  Drink plenty of fluids, such as water, ice chips, diluted fruit juice, and low-calorie sports drinks. You can drink milk also, if desired.  Avoid drinking fluids that contain a lot of sugar or caffeine, such as energy drinks, sports drinks, and soda.  Eat bland, easy-to-digest foods in small amounts as you are able. These foods include bananas, applesauce, rice, lean meats, toast, and crackers.  Avoid alcohol.  Avoid spicy or  fatty foods.  Medicines  Take over-the-counter and prescription medicines only as told by your health care provider.  If you were prescribed an antibiotic medicine, take it as told by your health care provider. Do not stop using the antibiotic even if you start to feel better. General instructions   Wash your hands often using soap and water. If soap and water are not available, use a hand sanitizer. Others in the household should wash their hands as well. Hands should be washed: ? After using the toilet or changing a diaper. ? Before preparing, cooking, or serving food. ? While caring for a sick person or while visiting someone in a hospital.  Drink enough fluid to keep your urine pale yellow.  Rest at home while you recover.  Watch your condition for any changes.  Take a warm bath to relieve any burning or pain from frequent diarrhea episodes.  Keep all follow-up visits as told by your health care provider. This is important. Contact a health care provider if:  You have a fever.  Your diarrhea gets worse.  You have new symptoms.  You cannot keep fluids down.  You feel light-headed or dizzy.  You have a headache.  You have muscle cramps. Get help right away if:  You have chest pain.  You feel extremely weak or you faint.  You have bloody or black stools or stools that look like tar.  You have severe pain, cramping, or bloating in your abdomen.  You have trouble breathing or you  are breathing very quickly.  Your heart is beating very quickly.  Your skin feels cold and clammy.  You feel confused.  You have signs of dehydration, such as: ? Dark urine, very little urine, or no urine. ? Cracked lips. ? Dry mouth. ? Sunken eyes. ? Sleepiness. ? Weakness. Summary  Diarrhea is frequent loose and watery bowel movements. Diarrhea can make you feel weak and cause you to become dehydrated.  Drink enough fluids to keep your urine pale yellow.  Make sure that  you wash your hands after using the toilet. If soap and water are not available, use hand sanitizer.  Contact a health care provider if your diarrhea gets worse or you have new symptoms.  Get help right away if you have signs of dehydration. This information is not intended to replace advice given to you by your health care provider. Make sure you discuss any questions you have with your health care provider. Document Revised: 02/02/2019 Document Reviewed: 02/20/2018 Elsevier Patient Education  2020 [employer].

## 2020-01-05 ENCOUNTER — Other Ambulatory Visit: Payer: Self-pay | Admitting: Family Medicine

## 2020-01-05 DIAGNOSIS — E785 Hyperlipidemia, unspecified: Secondary | ICD-10-CM

## 2020-01-05 NOTE — Telephone Encounter (Signed)
Requested medication (s) are due for refill today: yes  Requested medication (s) are on the active medication list: yes  Last refill:  09/23/19  Future visit scheduled: yes  Notes to clinic:  Last fill Dr Neva Seat noted patient needs lab check 6 weeks. I don't see that that had happened.    Requested Prescriptions  Pending Prescriptions Disp Refills   atorvastatin (LIPITOR) 10 MG tablet [Pharmacy Med Name: ATORVASTATIN CALCIUM 10 MG 10 Tablet] 90 tablet 0    Sig: TAKE 1 TABLET BY MOUTH DAILY.      Cardiovascular:  Antilipid - Statins Failed - 01/05/2020  4:56 PM      Failed - Total Cholesterol in normal range and within 360 days    Cholesterol, Total  Date Value Ref Range Status  09/16/2019 208 (H) 100 - 199 mg/dL Final          Failed - LDL in normal range and within 360 days    LDL Chol Calc (NIH)  Date Value Ref Range Status  09/16/2019 132 (H) 0 - 99 mg/dL Final          Passed - HDL in normal range and within 360 days    HDL  Date Value Ref Range Status  09/16/2019 49 >39 mg/dL Final          Passed - Triglycerides in normal range and within 360 days    Triglycerides  Date Value Ref Range Status  09/16/2019 149 0 - 149 mg/dL Final          Passed - Patient is not pregnant      Passed - Valid encounter within last 12 months    Recent Outpatient Visits           2 months ago Diarrhea, unspecified type   [facility], Asencion Partridge, MD   3 months ago Hand dermatitis   [facility], Asencion Partridge, MD   10 months ago Loss of weight   [facility], Asencion Partridge, MD   10 months ago Hyperlipidemia, unspecified hyperlipidemia type   [facility], Asencion Partridge, MD   1 year ago Weight loss   [facility], Asencion Partridge, MD       Future Appointments             In 2 months Neva Seat Asencion Partridge, MD [facility], [facility]

## 2020-03-16 ENCOUNTER — Ambulatory Visit: Payer: Federal, State, Local not specified - PPO | Admitting: Family Medicine

## 2020-03-23 ENCOUNTER — Encounter: Payer: Self-pay | Admitting: Family Medicine

## 2020-03-23 ENCOUNTER — Other Ambulatory Visit: Payer: Self-pay

## 2020-03-23 ENCOUNTER — Ambulatory Visit (INDEPENDENT_AMBULATORY_CARE_PROVIDER_SITE_OTHER): Payer: Federal, State, Local not specified - PPO | Admitting: Family Medicine

## 2020-03-23 VITALS — BP 126/84 | HR 51 | Temp 97.9°F | Ht 69.0 in | Wt 181.0 lb

## 2020-03-23 DIAGNOSIS — M7551 Bursitis of right shoulder: Secondary | ICD-10-CM

## 2020-03-23 DIAGNOSIS — E785 Hyperlipidemia, unspecified: Secondary | ICD-10-CM

## 2020-03-23 DIAGNOSIS — L309 Dermatitis, unspecified: Secondary | ICD-10-CM | POA: Diagnosis not present

## 2020-03-23 MED ORDER — ATORVASTATIN CALCIUM 10 MG PO TABS: 10.0000 mg | ORAL_TABLET | Freq: Every day | ORAL | 1 refills | Status: DC

## 2020-03-23 MED ORDER — CLOBETASOL PROPIONATE 0.15 MG/ACT (0.05%) EX LOTN: TOPICAL_LOTION | CUTANEOUS | 0 refills | Status: DC

## 2020-03-23 NOTE — Patient Instructions (Addendum)
Try higher dose of steroid once per day to the affected areas on the hands.  Continue to use Aveeno or Eucerin lotion to those dry areas.  Lotion can be used 3-4 times per day as needed, especially after bathing or washing hands.   Shoulder pain is likely due to bursitis.  See information below.  If not improving in the next few weeks, I would consider an injection.  Follow-up if that is needed.  No change in Lipitor dose for now.  Thank you for coming in today.  Return to the clinic or go to the nearest emergency room if any of your symptoms worsen or new symptoms occur.    Shoulder Impingement Syndrome  Shoulder impingement syndrome is a condition that causes pain when connective tissues (tendons) surrounding the shoulder joint become pinched. These tendons are part of the group of muscles and tissues that help to stabilize the shoulder (rotator cuff). Beneath the rotator cuff is a fluid-filled sac (bursa) that allows the muscles and tendons to glide smoothly. The bursa may become swollen or irritated (bursitis). Bursitis, swelling in the rotator cuff tendons, or both conditions can decrease how much space is under a bone in the shoulder joint (acromion), resulting in impingement. What are the causes? Shoulder impingement syndrome may be caused by bursitis or swelling of the rotator cuff tendons, which may result from:  Repetitive overhead arm movements.  Falling onto the shoulder.  Weakness in the shoulder muscles. What increases the risk? You may be more likely to develop this condition if you:  Play sports that involve throwing, such as baseball.  Participate in sports such as tennis, volleyball, and swimming.  Work as a Education administrator, Music therapist, or Pharmacologist. Some people are also more likely to develop impingement syndrome because of the shape of their acromion bone. What are the signs or symptoms? The main symptom of this condition is pain on the front or side of the shoulder.  The pain may:  Get worse when lifting or raising the arm.  Get worse at night.  Wake you up from sleeping.  Feel sharp when the shoulder is moved and then fade to an ache. Other symptoms may include:  Tenderness.  Stiffness.  Inability to raise the arm above shoulder level or behind the body.  Weakness. How is this diagnosed? This condition may be diagnosed based on:  Your symptoms and medical history.  A physical exam.  Imaging tests, such as: ? X-rays. ? MRI. ? Ultrasound. How is this treated? This condition may be treated by:  Resting your shoulder and avoiding all activities that cause pain or put stress on the shoulder.  Icing your shoulder.  NSAIDs to help reduce pain and swelling.  One or more injections of medicines to numb the area and reduce inflammation.  Physical therapy.  Surgery. This may be needed if nonsurgical treatments have not helped. Surgery may involve repairing the rotator cuff, reshaping the acromion, or removing the bursa. Follow these instructions at home: Managing pain, stiffness, and swelling   If directed, put ice on the injured area. ? Put ice in a plastic bag. ? Place a towel between your skin and the bag. ? Leave the ice on for 20 minutes, 2-3 times a day. Activity  Rest and return to your normal activities as told by your health care provider. Ask your health care provider what activities are safe for you.  Do exercises as told by your health care provider. General instructions  Do not  use any products that contain nicotine or tobacco, such as cigarettes, e-cigarettes, and chewing tobacco. These can delay healing. If you need help quitting, ask your health care provider.  Ask your health care provider when it is safe for you to drive.  Take over-the-counter and prescription medicines only as told by your health care provider.  Keep all follow-up visits as told by your health care provider. This is important. How is this  prevented?  Give your body time to rest between periods of activity.  Be safe and responsible while being active. This will help you avoid falls.  Maintain physical fitness, including strength and flexibility. Contact a health care provider if:  Your symptoms have not improved after 1-2 months of treatment and rest.  You cannot lift your arm away from your body. Summary  Shoulder impingement syndrome is a condition that causes pain when connective tissues (tendons) surrounding the shoulder joint become pinched.  The main symptom of this condition is pain on the front or side of the shoulder.  This condition is usually treated with rest, ice, and pain medicines as needed. This information is not intended to replace advice given to you by your health care provider. Make sure you discuss any questions you have with your health care provider. Document Revised: 01/08/2019 Document Reviewed: 03/11/2018 Elsevier Patient Education  2020 [employer]   Eczema Eczema is a broad term for a group of skin conditions that cause skin to become rough and inflamed. Each type of eczema has different triggers, symptoms, and treatments. Eczema of any type is usually itchy and symptoms range from mild to severe. Eczema and its symptoms are not spread from person to person (are not contagious). It can appear on different parts of the body at different times. Your eczema may not look the same as someone else's eczema. What are the types of eczema? Atopic dermatitis This is a long-term (chronic) skin disease that keeps coming back (recurring). Usual symptoms are dry skin and small, solid pimples that may swell and leak fluid (weep). Contact dermatitis  This happens when something irritates the skin and causes a rash. The irritation can come from substances that you are allergic to (allergens), such as poison ivy, chemicals, or medicines that were applied to your skin. Dyshidrotic eczema This is a form of  eczema on the hands and feet. It shows up as very itchy, fluid-filled blisters. It can affect people of any age, but is more common before age 55. Hand eczema  This causes very itchy areas of skin on the palms and sides of the hands and fingers. This type of eczema is common in industrial jobs where you may be exposed to many different types of irritants. Lichen simplex chronicus This type of eczema occurs when a person constantly scratches one area of the body. Repeated scratching of the area leads to thickened skin (lichenification). Lichen simplex chronicus can occur along with other types of eczema. It is more common in adults, but may be seen in children as well. Nummular eczema This is a common type of eczema. It has no known cause. It typically causes a red, circular, crusty lesion (plaque) that may be itchy. Scratching may become a habit and can cause bleeding. Nummular eczema occurs most often in people of middle-age or older. It most often affects the hands. Seborrheic dermatitis This is a common skin disease that mainly affects the scalp. It may also affect any oily areas of the body, such as the face,  sides of nose, eyebrows, ears, eyelids, and chest. It is marked by small scaling and redness of the skin (erythema). This can affect people of all ages. In infants, this condition is known as Chartered certified accountant." Stasis dermatitis This is a common skin disease that usually appears on the legs and feet. It most often occurs in people who have a condition that prevents blood from being pumped through the veins in the legs (chronic venous insufficiency). Stasis dermatitis is a chronic condition that needs long-term management. How is eczema diagnosed? Your health care provider will examine your skin and review your medical history. He or she may also give you skin patch tests. These tests involve taking patches that contain possible allergens and placing them on your back. He or she will then check in a  few days to see if an allergic reaction occurred. What are the common treatments? Treatment for eczema is based on the type of eczema you have. Hydrocortisone steroid medicine can relieve itching quickly and help reduce inflammation. This medicine may be prescribed or obtained over-the-counter, depending on the strength of the medicine that is needed. Follow these instructions at home:  Take over-the-counter and prescription medicines only as told by your health care provider.  Use creams or ointments to moisturize your skin. Do not use lotions.  Learn what triggers or irritates your symptoms. Avoid these things.  Treat symptom flare-ups quickly.  Do not itch your skin. This can make your rash worse.  Keep all follow-up visits as told by your health care provider. This is important. Where to find more information  The American Academy of Dermatology: http://jones-macias.info/  The National Eczema Association: www.nationaleczema.org Contact a health care provider if:  You have serious itching, even with treatment.  You regularly scratch your skin until it bleeds.  Your rash looks different than usual.  Your skin is painful, swollen, or more red than usual.  You have a fever. Summary  There are eight general types of eczema. Each type has different triggers.  Eczema of any type causes itching that may range from mild to severe.  Treatment varies based on the type of eczema you have. Hydrocortisone steroid medicine can help with itching and inflammation.  Protecting your skin is the best way to prevent eczema. Use moisturizers and lotions. Avoid triggers and irritants, and treat flare-ups quickly. This information is not intended to replace advice given to you by your health care provider. Make sure you discuss any questions you have with your health care provider. Document Revised: 08/29/2017 Document Reviewed: 01/30/2017 Elsevier Patient Education  [employer].   If you have lab  work done today you will be contacted with your lab results within the next 2 weeks.  If you have not heard from [place] then please contact us. The fastest way to get your results is to register for My Chart.   IF you received an x-ray today, you will receive an invoice from [facility]. Please contact [facility] at [phone] with questions or concerns regarding your invoice.   IF you received labwork today, you will receive an invoice from [facility]. Please contact [facility] at [phone] with questions or concerns regarding your invoice.   Our billing staff will not be able to assist you with questions regarding bills from these companies.  You will be contacted with the lab results as soon as they are available. The fastest way to get your results is to activate your My Chart account. Instructions are located on  the last page of this paperwork. If you have not heard from [place] regarding the results in 2 weeks, please contact this office.

## 2020-03-23 NOTE — Progress Notes (Signed)
Subjective:  Patient ID: Andrew Wolf, male    DOB: Jun 13, 1946  Age: 74 y.o. MRN: [id number]  CC:  Chief Complaint  Patient presents with  . hand dermititis    6 month F/U. pt still has this issue with no improvement. pt reports he was told we could increase his kenalog  cream if it wasn't helping. pt also states he has been using alot of hand sanitizer since last OV due to the pandemic and he is thinking the sanitizer is drying his skin out.  Marland Kitchen. Hyperlipidemia    pt reports no physical symptoms of this condition. pt reports taking his medication as prescribed with no known side effects.    HPI Andrew Larocheommy R Whittington presents for   Hyperlipidemia: Lipitor 10 mg daily.  Started after elevated readings in December. Taking daily. Occasional soreness in shoulder. treats with advil, but that is just past 30 days. No other myalgias.  Lab Results  Component Value Date   CHOL 208 (H) 09/16/2019   HDL 49 09/16/2019   LDLCALC 132 (H) 09/16/2019   TRIG 149 09/16/2019   CHOLHDL 4.2 09/16/2019   Lab Results  Component Value Date   ALT 15 09/16/2019   AST 19 09/16/2019   ALKPHOS 118 (H) 09/16/2019   BILITOT 0.4 09/16/2019    Hand dermatitis Discussed at September 16, 2019 visit.  Thought to be a component of dermatitis with increased handwashing and hand sanitizer with the COVID-19 pandemic.  Had tried Neosporin at that time.  Initially recommended Eucerin or Aveeno lotion few times per day, Kenalog 0.1% topical twice daily as needed with potential stronger steroid discussed.  About the same.  Kenalog 1-2 times per day, aveeno 1-2 times per day. Still dry section of palm - R greater than left.   R shoulder pain: Past month. NKI.  L hand dominant.  yardwork at home. No injury or new activity. No loss of use/rom.  Improves with advil at bedtime (time that it bothers him), aspercreme. Still some baseline soreness.  No weakness. No prior surgery/injection.      History Patient Active  Problem List   Diagnosis Date Noted  . GERD (gastroesophageal reflux disease) 02/11/2013   Past Medical History:  Diagnosis Date  . Allergy   . Anxiety   . Depression   . GERD (gastroesophageal reflux disease)    Past Surgical History:  Procedure Laterality Date  . KNEE SURGERY  2004   No Known Allergies Prior to Admission medications   Medication Sig Start Date End Date Taking? Authorizing Provider  atorvastatin (LIPITOR) 10 MG tablet TAKE 1 TABLET BY MOUTH DAILY. 01/06/20  Yes Shade FloodGreene, Vestal Markin R, MD  Cholecalciferol (VITAMIN D-3 PO) Take by mouth.   Yes [provider]  esomeprazole (NEXIUM) 40 MG capsule Take 40 mg by mouth 2 (two) times daily.   Yes [provider]  triamcinolone cream (KENALOG) 0.1 % Apply 1 application topically 2 (two) times daily. 09/16/19  Yes Shade FloodGreene, Reese Senk R, MD  valACYclovir (VALTREX) 1000 MG tablet Use 2 tablets initially, then 2 in 12 hours for fever blisters 03/08/19  Yes Shade FloodGreene, Jeret Goyer R, MD  tamsulosin (FLOMAX) 0.4 MG CAPS capsule Take 0.4 mg by mouth. Patient not taking: Reported on 03/23/2020    [provider]   Social History   Socioeconomic History  . Marital status: Married    Spouse name: Not on file  . Number of children: Not on file  . Years of education: Not on file  .  Highest education level: Not on file  Occupational History  . Not on file  Tobacco Use  . Smoking status: Passive Smoke Exposure - Never Smoker  . Smokeless tobacco: Never Used  Substance and Sexual Activity  . Alcohol use: Yes    Alcohol/week: 1.0 standard drink    Types: 1 Glasses of wine per week  . Drug use: No  . Sexual activity: Yes  Other Topics Concern  . Not on file  Social History Narrative  . Not on file   Social Determinants of Health   Financial Resource Strain:   . Difficulty of Paying Living Expenses:   Food Insecurity:   . Worried About Charity fundraiser in the Last Year:   . Arboriculturist in the Last Year:     Transportation Needs:   . Film/video editor (Medical):   Marland Kitchen Lack of Transportation (Non-Medical):   Physical Activity:   . Days of Exercise per Week:   . Minutes of Exercise per Session:   Stress:   . Feeling of Stress :   Social Connections:   . Frequency of Communication with Friends and Family:   . Frequency of Social Gatherings with Friends and Family:   . Attends Religious Services:   . Active Member of Clubs or Organizations:   . Attends Archivist Meetings:   Marland Kitchen Marital Status:   Intimate Partner Violence:   . Fear of Current or Ex-Partner:   . Emotionally Abused:   Marland Kitchen Physically Abused:   . Sexually Abused:     Review of Systems Per HPI.   Objective:   Vitals:   03/23/20 1150  BP: 126/84  Pulse: (!) 51  Temp: 97.9 F (36.6 C)  TempSrc: Temporal  SpO2: 100%  Weight: 181 lb (82.1 kg)  Height: 5\' 9"  (1.753 m)     Physical Exam Vitals reviewed.  Constitutional:      Appearance: He is well-developed.  HENT:     Head: Normocephalic and atraumatic.  Eyes:     Pupils: Pupils are equal, round, and reactive to light.  Neck:     Vascular: No carotid bruit or JVD.  Cardiovascular:     Rate and Rhythm: Normal rate and regular rhythm.     Heart sounds: Normal heart sounds. No murmur heard.   Pulmonary:     Effort: Pulmonary effort is normal.     Breath sounds: Normal breath sounds. No rales.  Musculoskeletal:     Comments: C-spine nontender, pain-free range of motion, does not cause right shoulder pain.  Right shoulder AC, [place], clavicle nontender.  No focal bony tenderness.  Full rotator cuff strength.  Minimal discomfort with resisted external rotation but range of motion intact.  Positive Neer, positive [place].  Skin:    General: Skin is warm and dry.     Comments: Dry excoriated skin right greater than left palm, see photo.  No surrounding erythema.  Neurological:     Mental Status: He is alert and oriented to person, place, and time.          Assessment & Plan:  BLAZE NYLUND is a 74 y.o. male . Hand dermatitis - Plan: Clobetasol Propionate 0.15 MG/ACT (0.05%) LOTN  -Trial of higher potency topical steroid.  Lowest effective dose for shortest duration discussed.  Continue hydrating lotion, especially after bathing, handwashing.  Hyperlipidemia, unspecified hyperlipidemia type - Plan: atorvastatin (LIPITOR) 10 MG tablet, Lipid panel, Comprehensive metabolic panel  -Tolerating Lipitor, continue same, labs  pending  Bursitis of right shoulder  -Suspected subacromial bursitis/impingement.  Handout given, can look to see if any specific activities at home may be contributing.  Episodic NSAID temporarily with topical treatment okay for now.  Would consider injection in the next few weeks if not improving.  Meds ordered this encounter  Medications  . atorvastatin (LIPITOR) 10 MG tablet    Sig: Take 1 tablet (10 mg total) by mouth daily.    Dispense:  90 tablet    Refill:  1  . Clobetasol Propionate 0.15 MG/ACT (0.05%) LOTN    Sig: Apply to affected are on hands once per day as needed only.    Dispense:  68 g    Refill:  0   Patient Instructions   Try higher dose of steroid once per day to the affected areas on the hands.  Continue to use Aveeno or Eucerin lotion to those dry areas.  Lotion can be used 3-4 times per day as needed, especially after bathing or washing hands.   Shoulder pain is likely due to bursitis.  See information below.  If not improving in the next few weeks, I would consider an injection.  Follow-up if that is needed.  No change in Lipitor dose for now.  Thank you for coming in today.  Return to the clinic or go to the nearest emergency room if any of your symptoms worsen or new symptoms occur.    Shoulder Impingement Syndrome  Shoulder impingement syndrome is a condition that causes pain when connective tissues (tendons) surrounding the shoulder joint become pinched. These tendons are  part of the group of muscles and tissues that help to stabilize the shoulder (rotator cuff). Beneath the rotator cuff is a fluid-filled sac (bursa) that allows the muscles and tendons to glide smoothly. The bursa may become swollen or irritated (bursitis). Bursitis, swelling in the rotator cuff tendons, or both conditions can decrease how much space is under a bone in the shoulder joint (acromion), resulting in impingement. What are the causes? Shoulder impingement syndrome may be caused by bursitis or swelling of the rotator cuff tendons, which may result from:  Repetitive overhead arm movements.  Falling onto the shoulder.  Weakness in the shoulder muscles. What increases the risk? You may be more likely to develop this condition if you:  Play sports that involve throwing, such as baseball.  Participate in sports such as tennis, volleyball, and swimming.  Work as a Education administrator, Music therapist, or Pharmacologist. Some people are also more likely to develop impingement syndrome because of the shape of their acromion bone. What are the signs or symptoms? The main symptom of this condition is pain on the front or side of the shoulder. The pain may:  Get worse when lifting or raising the arm.  Get worse at night.  Wake you up from sleeping.  Feel sharp when the shoulder is moved and then fade to an ache. Other symptoms may include:  Tenderness.  Stiffness.  Inability to raise the arm above shoulder level or behind the body.  Weakness. How is this diagnosed? This condition may be diagnosed based on:  Your symptoms and medical history.  A physical exam.  Imaging tests, such as: ? X-rays. ? MRI. ? Ultrasound. How is this treated? This condition may be treated by:  Resting your shoulder and avoiding all activities that cause pain or put stress on the shoulder.  Icing your shoulder.  NSAIDs to help reduce pain and swelling.  One or  more injections of medicines to numb the area  and reduce inflammation.  Physical therapy.  Surgery. This may be needed if nonsurgical treatments have not helped. Surgery may involve repairing the rotator cuff, reshaping the acromion, or removing the bursa. Follow these instructions at home: Managing pain, stiffness, and swelling   If directed, put ice on the injured area. ? Put ice in a plastic bag. ? Place a towel between your skin and the bag. ? Leave the ice on for 20 minutes, 2-3 times a day. Activity  Rest and return to your normal activities as told by your health care provider. Ask your health care provider what activities are safe for you.  Do exercises as told by your health care provider. General instructions  Do not use any products that contain nicotine or tobacco, such as cigarettes, e-cigarettes, and chewing tobacco. These can delay healing. If you need help quitting, ask your health care provider.  Ask your health care provider when it is safe for you to drive.  Take over-the-counter and prescription medicines only as told by your health care provider.  Keep all follow-up visits as told by your health care provider. This is important. How is this prevented?  Give your body time to rest between periods of activity.  Be safe and responsible while being active. This will help you avoid falls.  Maintain physical fitness, including strength and flexibility. Contact a health care provider if:  Your symptoms have not improved after 1-2 months of treatment and rest.  You cannot lift your arm away from your body. Summary  Shoulder impingement syndrome is a condition that causes pain when connective tissues (tendons) surrounding the shoulder joint become pinched.  The main symptom of this condition is pain on the front or side of the shoulder.  This condition is usually treated with rest, ice, and pain medicines as needed. This information is not intended to replace advice given to you by your health care  provider. Make sure you discuss any questions you have with your health care provider. Document Revised: 01/08/2019 Document Reviewed: 03/11/2018 Elsevier Patient Education  2020 Elsevier Inc.   Eczema Eczema is a broad term for a group of skin conditions that cause skin to become rough and inflamed. Each type of eczema has different triggers, symptoms, and treatments. Eczema of any type is usually itchy and symptoms range from mild to severe. Eczema and its symptoms are not spread from person to person (are not contagious). It can appear on different parts of the body at different times. Your eczema may not look the same as someone else's eczema. What are the types of eczema? Atopic dermatitis This is a long-term (chronic) skin disease that keeps coming back (recurring). Usual symptoms are dry skin and small, solid pimples that may swell and leak fluid (weep). Contact dermatitis  This happens when something irritates the skin and causes a rash. The irritation can come from substances that you are allergic to (allergens), such as poison ivy, chemicals, or medicines that were applied to your skin. Dyshidrotic eczema This is a form of eczema on the hands and feet. It shows up as very itchy, fluid-filled blisters. It can affect people of any age, but is more common before age 20. Hand eczema  This causes very itchy areas of skin on the palms and sides of the hands and fingers. This type of eczema is common in industrial jobs where you may be exposed to many different types of irritants. Lichen simplex  chronicus This type of eczema occurs when a person constantly scratches one area of the body. Repeated scratching of the area leads to thickened skin (lichenification). Lichen simplex chronicus can occur along with other types of eczema. It is more common in adults, but may be seen in children as well. Nummular eczema This is a common type of eczema. It has no known cause. It typically causes a red,  circular, crusty lesion (plaque) that may be itchy. Scratching may become a habit and can cause bleeding. Nummular eczema occurs most often in people of middle-age or older. It most often affects the hands. Seborrheic dermatitis This is a common skin disease that mainly affects the scalp. It may also affect any oily areas of the body, such as the face, sides of nose, eyebrows, ears, eyelids, and chest. It is marked by small scaling and redness of the skin (erythema). This can affect people of all ages. In infants, this condition is known as Location manager." Stasis dermatitis This is a common skin disease that usually appears on the legs and feet. It most often occurs in people who have a condition that prevents blood from being pumped through the veins in the legs (chronic venous insufficiency). Stasis dermatitis is a chronic condition that needs long-term management. How is eczema diagnosed? Your health care provider will examine your skin and review your medical history. He or she may also give you skin patch tests. These tests involve taking patches that contain possible allergens and placing them on your back. He or she will then check in a few days to see if an allergic reaction occurred. What are the common treatments? Treatment for eczema is based on the type of eczema you have. Hydrocortisone steroid medicine can relieve itching quickly and help reduce inflammation. This medicine may be prescribed or obtained over-the-counter, depending on the strength of the medicine that is needed. Follow these instructions at home:  Take over-the-counter and prescription medicines only as told by your health care provider.  Use creams or ointments to moisturize your skin. Do not use lotions.  Learn what triggers or irritates your symptoms. Avoid these things.  Treat symptom flare-ups quickly.  Do not itch your skin. This can make your rash worse.  Keep all follow-up visits as told by your health care  provider. This is important. Where to find more information  The American Academy of Dermatology: InfoExam.si  The National Eczema Association: www.nationaleczema.org Contact a health care provider if:  You have serious itching, even with treatment.  You regularly scratch your skin until it bleeds.  Your rash looks different than usual.  Your skin is painful, swollen, or more red than usual.  You have a fever. Summary  There are eight general types of eczema. Each type has different triggers.  Eczema of any type causes itching that may range from mild to severe.  Treatment varies based on the type of eczema you have. Hydrocortisone steroid medicine can help with itching and inflammation.  Protecting your skin is the best way to prevent eczema. Use moisturizers and lotions. Avoid triggers and irritants, and treat flare-ups quickly. This information is not intended to replace advice given to you by your health care provider. Make sure you discuss any questions you have with your health care provider. Document Revised: 08/29/2017 Document Reviewed: 01/30/2017 Elsevier Patient Education  [employer].   If you have lab work done today you will be contacted with your lab results within the next 2 weeks.  If you have not heard from [place] then please contact us. The fastest way to get your results is to register for My Chart.   IF you received an x-ray today, you will receive an invoice from [facility]. Please contact [facility] at [phone] with questions or concerns regarding your invoice.   IF you received labwork today, you will receive an invoice from [facility]. Please contact [facility] at [phone] with questions or concerns regarding your invoice.   Our billing staff will not be able to assist you with questions regarding bills from these companies.  You will be contacted with the lab results as soon as they are available. The fastest way to get your  results is to activate your My Chart account. Instructions are located on the last page of this paperwork. If you have not heard from [place] regarding the results in 2 weeks, please contact this office.         Signed, [name], MD [facility]

## 2020-03-24 LAB — COMPREHENSIVE METABOLIC PANEL
ALT: 13 IU/L (ref 0–44)
AST: 18 IU/L (ref 0–40)
Albumin/Globulin Ratio: 1.7 (ref 1.2–2.2)
Albumin: 4.4 g/dL (ref 3.7–4.7)
Alkaline Phosphatase: 112 IU/L (ref 48–121)
BUN/Creatinine Ratio: 15 (ref 10–24)
BUN: 17 mg/dL (ref 8–27)
Bilirubin Total: 0.4 mg/dL (ref 0.0–1.2)
CO2: 25 mmol/L (ref 20–29)
Calcium: 9.5 mg/dL (ref 8.6–10.2)
Chloride: 101 mmol/L (ref 96–106)
Creatinine, Ser: 1.11 mg/dL (ref 0.76–1.27)
GFR calc Af Amer: 76 mL/min/{1.73_m2} (ref >59)
GFR calc non Af Amer: 66 mL/min/{1.73_m2} (ref >59)
Globulin, Total: 2.6 g/dL (ref 1.5–4.5)
Glucose: 92 mg/dL (ref 65–99)
Potassium: 5 mmol/L (ref 3.5–5.2)
Sodium: 139 mmol/L (ref 134–144)
Total Protein: 7 g/dL (ref 6.0–8.5)

## 2020-03-24 LAB — LIPID PANEL
Chol/HDL Ratio: 2.7 ratio (ref 0.0–5.0)
Cholesterol, Total: 132 mg/dL (ref 100–199)
HDL: 49 mg/dL (ref >39)
LDL Chol Calc (NIH): 64 mg/dL (ref 0–99)
Triglycerides: 106 mg/dL (ref 0–149)
VLDL Cholesterol Cal: 19 mg/dL (ref 5–40)

## 2020-05-22 ENCOUNTER — Encounter: Payer: Self-pay | Admitting: Family Medicine

## 2020-05-22 ENCOUNTER — Ambulatory Visit (INDEPENDENT_AMBULATORY_CARE_PROVIDER_SITE_OTHER): Payer: Federal, State, Local not specified - PPO | Admitting: Family Medicine

## 2020-05-22 ENCOUNTER — Other Ambulatory Visit: Payer: Self-pay

## 2020-05-22 VITALS — BP 125/81 | HR 62 | Temp 97.7°F | Ht 69.0 in | Wt 182.0 lb

## 2020-05-22 DIAGNOSIS — K120 Recurrent oral aphthae: Secondary | ICD-10-CM | POA: Diagnosis not present

## 2020-05-22 DIAGNOSIS — B009 Herpesviral infection, unspecified: Secondary | ICD-10-CM

## 2020-05-22 MED ORDER — VALACYCLOVIR HCL 1 G PO TABS: ORAL_TABLET | ORAL | 3 refills | Status: DC

## 2020-05-22 MED ORDER — TRIAMCINOLONE ACETONIDE 0.1 % MT PSTE: 1.0000 "application " | PASTE | Freq: Two times a day (BID) | OROMUCOSAL | 1 refills | Status: AC

## 2020-05-22 NOTE — Patient Instructions (Addendum)
  Ulcer could have been related to herpes virus, but as it is improving I would not recommend any new medications at this time.  If you have a return of cold sore symptoms, start Valtrex 2 pills initially with repeat dose in 12 hours once.  If you have a return of canker sore or ulcer and it is not mproving within a week, can apply the steroid paste if needed.  If you continue to have these frequently or new areas of concern, please follow-up to discuss further.  Keep follow-up with dentist as planned.  Let me know if there are questions   If you have lab work done today you will be contacted with your lab results within the next 2 weeks.  If you have not heard from [place] then please contact us. The fastest way to get your results is to register for My Chart.   IF you received an x-ray today, you will receive an invoice from [facility]. Please contact [facility] at [phone] with questions or concerns regarding your invoice.   IF you received labwork today, you will receive an invoice from [place]. Please contact [facility] at [phone] with questions or concerns regarding your invoice.   Our billing staff will not be able to assist you with questions regarding bills from these companies.  You will be contacted with the lab results as soon as they are available. The fastest way to get your results is to activate your My Chart account. Instructions are located on the last page of this paperwork. If you have not heard from [place] regarding the results in 2 weeks, please contact this office.

## 2020-05-22 NOTE — Progress Notes (Signed)
Subjective:  Patient ID: Andrew Wolf, male    DOB: 07/12/1946  Age: 74 y.o. MRN: [id number]  CC:  Chief Complaint  Patient presents with  . oral blister    Pt reports he has a blister on the insode of his lower lip in the center. Pt reports he has had th blister for 30 days and it was causeing him pain. pt reports that on 05/19/2020 it started to clear up. pt reports when he eats now he gets the pain, but it isn't as constatant as it was the prior 30 days.    HPI KENNTH Wolf presents for   Lip lesion: Fever blister started about 6 weeks ago, inside lower lip - had opened to ulcer when he noticed it. Did have initial tingling on upper lip. Unable to get refill valtrex - rx expired.  Ulcer was painful.  Increased in size over a week or two.  Tx: H2O2, lessened soreness. Food would impact in area and sore. Improving in past 4 days - now less sore. Almost healed today.  Has dental visit soon.   No tobacco, no chewing tobacco.  No fever.  No face swelling/redness.   Immunization History  Administered Date(s) Administered  . Influenza Split 07/10/2012  . Influenza,inj,Quad PF,6+ Mos 07/15/2013, 07/16/2016  . Moderna SARS-COVID-2 Vaccination 11/13/2019, 12/11/2019  . Pneumococcal Conjugate-13 04/14/2018  . Pneumococcal Polysaccharide-23 08/19/2013  . Tdap 08/19/2013      History Patient Active Problem List   Diagnosis Date Noted  . GERD (gastroesophageal reflux disease) 02/11/2013   Past Medical History:  Diagnosis Date  . Allergy   . Anxiety   . Depression   . GERD (gastroesophageal reflux disease)    Past Surgical History:  Procedure Laterality Date  . KNEE SURGERY  2004   No Known Allergies Prior to Admission medications   Medication Sig Start Date End Date Taking? Authorizing Provider  atorvastatin (LIPITOR) 10 MG tablet Take 1 tablet (10 mg total) by mouth daily. 03/23/20  Yes [name], MD  Cholecalciferol (VITAMIN D-3 PO) Take by mouth.   Yes  [provider]  Clobetasol Propionate 0.15 MG/ACT (0.05%) LOTN Apply to affected are on hands once per day as needed only. 03/23/20  Yes [name], MD  esomeprazole (NEXIUM) 40 MG capsule Take 40 mg by mouth 2 (two) times daily.   Yes [provider]  triamcinolone cream (KENALOG) 0.1 % Apply 1 application topically 2 (two) times daily. 09/16/19  Yes [name], MD  valACYclovir (VALTREX) 1000 MG tablet Use 2 tablets initially, then 2 in 12 hours for fever blisters 03/08/19  Yes [name], MD  tamsulosin (FLOMAX) 0.4 MG CAPS capsule Take 0.4 mg by mouth. Patient not taking: Reported on 03/23/2020    [provider]   Social History   Socioeconomic History  . Marital status: Married    Spouse name: Not on file  . Number of children: Not on file  . Years of education: Not on file  . Highest education level: Not on file  Occupational History  . Not on file  Tobacco Use  . Smoking status: Passive Smoke Exposure - Never Smoker  . Smokeless tobacco: Never Used  Substance and Sexual Activity  . Alcohol use: Yes    Alcohol/week: 1.0 standard drink    Types: 1 Glasses of wine per week  . Drug use: No  . Sexual activity: Yes  Other Topics Concern  . Not on file  Social History Narrative  . Not on file   Social Determinants of Health   Financial Resource Strain:   . Difficulty of Paying Living Expenses: Not on file  Food Insecurity:   . Worried About Programme researcher, broadcasting/film/video in the Last Year: Not on file  . Ran Out of Food in the Last Year: Not on file  Transportation Needs:   . Lack of Transportation (Medical): Not on file  . Lack of Transportation (Non-Medical): Not on file  Physical Activity:   . Days of Exercise per Week: Not on file  . Minutes of Exercise per Session: Not on file  Stress:   . Feeling of Stress : Not on file  Social Connections:   . Frequency of Communication with Friends and Family: Not on file  . Frequency of  Social Gatherings with Friends and Family: Not on file  . Attends Religious Services: Not on file  . Active Member of Clubs or Organizations: Not on file  . Attends Banker Meetings: Not on file  . Marital Status: Not on file  Intimate Partner Violence:   . Fear of Current or Ex-Partner: Not on file  . Emotionally Abused: Not on file  . Physically Abused: Not on file  . Sexually Abused: Not on file    Review of Systems Per HPI.   Objective:   Vitals:   05/22/20 0943  BP: 125/81  Pulse: 62  Temp: 97.7 F (36.5 C)  TempSrc: Temporal  SpO2: 98%  Weight: 182 lb (82.6 kg)  Height: 5\' 9"  (1.753 m)     Physical Exam Constitutional:      General: He is not in acute distress.    Appearance: He is well-developed.  HENT:     Head: Normocephalic and atraumatic.     Nose: Nose normal.     Mouth/Throat:     Mouth: Mucous membranes are moist.     Pharynx: No oropharyngeal exudate or posterior oropharyngeal erythema.     Comments: Very faint healing ulcerative area at the lower buccal mucosa in front of central incisors.  No other ulcerations, no masses appreciated.  External skin of lips normal.  No submandibular, submental or other lymphadenopathy appreciated Cardiovascular:     Rate and Rhythm: Normal rate.  Pulmonary:     Effort: Pulmonary effort is normal.  Neurological:     Mental Status: He is alert and oriented to person, place, and time.     Assessment & Plan:  Andrew Wolf is a 74 y.o. male . Canker sore - Plan: triamcinolone (KENALOG) 0.1 % paste  HSV-1 (herpes simplex virus 1) infection - Plan: valACYclovir (VALTREX) 1000 MG tablet Possible HSV aphthous ulcer.  Now improving.  Refill Valtrex if recurrent HSV symptoms.  Kenalog in [employer] printed if needed for future aphthous ulcer that is not improving quickly.  Keep follow-up with dentist as routinely scheduled as oral cancer screening is also performed there.  RTC precautions if recurrence.   Meds  ordered this encounter  Medications  . valACYclovir (VALTREX) 1000 MG tablet    Sig: Use 2 tablets initially, then 2 in 12 hours for fever blisters    Dispense:  8 tablet    Refill:  3  . triamcinolone (KENALOG) 0.1 % paste    Sig: Use as directed 1 application in the mouth or throat 2 (two) times daily. Apply to oral ulcer up to twice per day if needed.    Dispense:  5 g  Refill:  1   Patient Instructions    Ulcer could have been related to herpes virus, but as it is improving I would not recommend any new medications at this time.  If you have a return of cold sore symptoms, start Valtrex 2 pills initially with repeat dose in 12 hours once.  If you have a return of canker sore or ulcer and it is not mproving within a week, can apply the steroid paste if needed.  If you continue to have these frequently or new areas of concern, please follow-up to discuss further.  Keep follow-up with dentist as planned.  Let me know if there are questions   If you have lab work done today you will be contacted with your lab results within the next 2 weeks.  If you have not heard from [place] then please contact us. The fastest way to get your results is to register for My Chart.   IF you received an x-ray today, you will receive an invoice from [facility]. Please contact [facility] at [phone] with questions or concerns regarding your invoice.   IF you received labwork today, you will receive an invoice from [place]. Please contact [facility] at [phone] with questions or concerns regarding your invoice.   Our billing staff will not be able to assist you with questions regarding bills from these companies.  You will be contacted with the lab results as soon as they are available. The fastest way to get your results is to activate your My Chart account. Instructions are located on the last page of this paperwork. If you have not heard from [place] regarding the results in 2 weeks, please  contact this office.         Signed, [name], MD [facility]

## 2020-06-09 ENCOUNTER — Other Ambulatory Visit: Payer: Self-pay

## 2020-06-09 ENCOUNTER — Ambulatory Visit
Admission: EM | Admit: 2020-06-09 | Discharge: 2020-06-09 | Disposition: A | Discharge status: Home / Self Care | Payer: Federal, State, Local not specified - PPO | Attending: Emergency Medicine | Admitting: Emergency Medicine

## 2020-06-09 DIAGNOSIS — J029 Acute pharyngitis, unspecified: Secondary | ICD-10-CM

## 2020-06-09 NOTE — ED Provider Notes (Signed)
[facility]    CSN: [id number] Arrival date & time: 06/09/20  1546      History   Chief Complaint Chief Complaint  Patient presents with  . Sore Throat    HPI Andrew Wolf is a 74 y.o. male  Presenting for sore throat x2 weeks.  Patient states that he tried seeking evaluation with his PCP: "They would not see me because is a symptom of Covid ".  Denies change in reflux, medications, diet or lifestyle.  No difficulty breathing or swelling.  Denies fever, thoughts, myalgias, chest pain, cough.  Past Medical History:  Diagnosis Date  . Allergy   . Anxiety   . Depression   . GERD (gastroesophageal reflux disease)     Patient Active Problem List   Diagnosis Date Noted  . GERD (gastroesophageal reflux disease) 02/11/2013    Past Surgical History:  Procedure Laterality Date  . KNEE SURGERY  2004       Home Medications    Prior to Admission medications   Medication Sig Start Date End Date Taking? Authorizing Provider  atorvastatin (LIPITOR) 10 MG tablet Take 1 tablet (10 mg total) by mouth daily. 03/23/20   [name], MD  Cholecalciferol (VITAMIN D-3 PO) Take by mouth.    [provider]  Clobetasol Propionate 0.15 MG/ACT (0.05%) LOTN Apply to affected are on hands once per day as needed only. 03/23/20   [name], MD  esomeprazole (NEXIUM) 40 MG capsule Take 40 mg by mouth 2 (two) times daily.    [provider]  triamcinolone (KENALOG) 0.1 % paste Use as directed 1 application in the mouth or throat 2 (two) times daily. Apply to oral ulcer up to twice per day if needed. 05/22/20   [name], MD  triamcinolone cream (KENALOG) 0.1 % Apply 1 application topically 2 (two) times daily. 09/16/19   [name], MD  valACYclovir (VALTREX) 1000 MG tablet Use 2 tablets initially, then 2 in 12 hours for fever blisters 05/22/20   [name], MD    Family History Family History  Adopted: Yes  Problem Relation Age  of Onset  . Hypertension Sister   . Hypertension Sister     Social History Social History   Tobacco Use  . Smoking status: Passive Smoke Exposure - Never Smoker  . Smokeless tobacco: Never Used  Substance Use Topics  . Alcohol use: Yes    Alcohol/week: 1.0 standard drink    Types: 1 Glasses of wine per week  . Drug use: No     Allergies   Patient has no known allergies.   Review of Systems As per HPI   Physical Exam Triage Vital Signs ED Triage Vitals  Enc Vitals Group     BP 06/09/20 1750 (!) 147/85     Pulse Rate 06/09/20 1750 80     Resp 06/09/20 1750 18     Temp 06/09/20 1750 98.9 F (37.2 C)     Temp Source 06/09/20 1750 Oral     SpO2 06/09/20 1750 96 %     Weight --      Height --      Head Circumference --      Peak Flow --      Pain Score 06/09/20 1751 8     Pain Loc --      Pain Edu? --      Excl. in GC? --    No data found.  Updated Vital  Signs BP (!) 147/85 (BP Location: Left Arm)   Pulse 80   Temp 98.9 F (37.2 C) (Oral)   Resp 18   SpO2 96%   Visual Acuity Right Eye Distance:   Left Eye Distance:   Bilateral Distance:    Right Eye Near:   Left Eye Near:    Bilateral Near:     Physical Exam Constitutional:      General: He is not in acute distress. HENT:     Head: Normocephalic and atraumatic.     Right Ear: Tympanic membrane and ear canal normal.     Left Ear: Tympanic membrane and ear canal normal.     Mouth/Throat:     Mouth: Mucous membranes are moist.     Pharynx: Uvula midline. No posterior oropharyngeal erythema or uvula swelling.     Tonsils: No tonsillar exudate.  Eyes:     General: No scleral icterus.    Pupils: Pupils are equal, round, and reactive to light.  Cardiovascular:     Rate and Rhythm: Normal rate and regular rhythm.  Pulmonary:     Effort: Pulmonary effort is normal. No respiratory distress.     Breath sounds: No wheezing.  Skin:    Coloration: Skin is not jaundiced or pale.  Neurological:      Mental Status: He is alert and oriented to person, place, and time.      UC Treatments / Results  Labs (all labs ordered are listed, but only abnormal results are displayed) Labs Reviewed  NOVEL CORONAVIRUS, NAA    EKG   Radiology No results found.  Procedures Procedures (including critical care time)  Medications Ordered in UC Medications - No data to display  Initial Impression / Assessment and Plan / UC Course  I have reviewed the triage vital signs and the nursing notes.  Pertinent labs & imaging results that were available during my care of the patient were reviewed by me and considered in my medical decision making (see chart for details).     Patient afebrile, nontoxic, with SpO2 96%.  Covid PCR pending.  Patient to quarantine until results are back.  We will treat supportively as outlined below.  Return precautions discussed, patient verbalized understanding and is agreeable to plan. Final Clinical Impressions(s) / UC Diagnoses   Final diagnoses:  Sore throat     Discharge Instructions     Your COVID test is pending - it is important to quarantine / isolate at home until your results are back. If you test positive and would like further evaluation for persistent or worsening symptoms, you may schedule an E-visit or virtual (video) visit throughout the [facility] app or website.  PLEASE NOTE: If you develop severe chest pain or shortness of breath please go to the ER or call 9-1-1 for further evaluation --> DO NOT schedule electronic or virtual visits for this. Please call our office for further guidance / recommendations as needed.  For information about the Covid vaccine, please visit [email or web address]    ED Prescriptions    None     PDMP not reviewed this encounter.   [place], [place], [place] 06/09/20 1941

## 2020-06-09 NOTE — Discharge Instructions (Addendum)
Your COVID test is pending - it is important to quarantine / isolate at home until your results are back. °If you test positive and would like further evaluation for persistent or worsening symptoms, you may schedule an E-visit or virtual (video) visit throughout the [facility] MyChart app or website. ° °PLEASE NOTE: If you develop severe chest pain or shortness of breath please go to the ER or call 9-1-1 for further evaluation --> DO NOT schedule electronic or virtual visits for this. °Please call our office for further guidance / recommendations as needed. ° °For information about the Covid vaccine, please visit [email or web address] °

## 2020-06-09 NOTE — ED Triage Notes (Signed)
Pt c/o sore throat for the past 2 wks. States his PCP sent him here.

## 2020-06-12 LAB — NOVEL CORONAVIRUS, NAA: SARS-CoV-2, NAA: NOT DETECTED

## 2020-09-14 ENCOUNTER — Other Ambulatory Visit: Payer: Self-pay

## 2020-09-14 ENCOUNTER — Ambulatory Visit (INDEPENDENT_AMBULATORY_CARE_PROVIDER_SITE_OTHER): Payer: Federal, State, Local not specified - PPO | Admitting: Family Medicine

## 2020-09-14 ENCOUNTER — Encounter: Payer: Self-pay | Admitting: Family Medicine

## 2020-09-14 DIAGNOSIS — B009 Herpesviral infection, unspecified: Secondary | ICD-10-CM | POA: Diagnosis not present

## 2020-09-14 DIAGNOSIS — E785 Hyperlipidemia, unspecified: Secondary | ICD-10-CM

## 2020-09-14 MED ORDER — ATORVASTATIN CALCIUM 10 MG PO TABS: 10.0000 mg | ORAL_TABLET | Freq: Every day | ORAL | 2 refills | Status: DC

## 2020-09-14 MED ORDER — VALACYCLOVIR HCL 1 G PO TABS: ORAL_TABLET | ORAL | 5 refills | Status: DC

## 2020-09-14 NOTE — Progress Notes (Signed)
Subjective:  Patient ID: Andrew Wolf, male    DOB: 04-06-1946  Age: 74 y.o. MRN: [id number]  CC:  Chief Complaint  Patient presents with  . Medication Refill    On Valtrex and Lipitor. Pt reports these medications works well with no side effects.    HPI ENOS MUHL presents for  Hyperlipidemia: Lipitor 10 mg daily.  No new myalgias/side effects.  Fasting this am.  Lab Results  Component Value Date   CHOL 132 03/23/2020   HDL 49 03/23/2020   LDLCALC 64 03/23/2020   TRIG 106 03/23/2020   CHOLHDL 2.7 03/23/2020   Lab Results  Component Value Date   ALT 13 03/23/2020   AST 18 03/23/2020   ALKPHOS 112 03/23/2020   BILITOT 0.4 03/23/2020   HSV History of orolabial HSV, treated with Valtrex previously.  Canker sore discussed in August.  Unable to initially get refill of Valtrex at that time his prescription had expired.  Area was almost healed at time of presentation August 23.  Not on daily dosing, acute treatment only with Valtrex 2 g x 1 with repeat in 12 hours. One flair 3 weeks ago. Inside lower lip Took 2 pills, then 12 hr repeat. Tolerated dose. Helped with quicker recovery. appox every 4-5 months. Option of daily dosing - deferred.    Health maintenance COVID-19 vaccine in February, March, booster    History Patient Active Problem List   Diagnosis Date Noted  . GERD (gastroesophageal reflux disease) 02/11/2013   Past Medical History:  Diagnosis Date  . Allergy   . Anxiety   . Depression   . GERD (gastroesophageal reflux disease)    Past Surgical History:  Procedure Laterality Date  . KNEE SURGERY  2004   No Known Allergies Prior to Admission medications   Medication Sig Start Date End Date Taking? Authorizing Provider  atorvastatin (LIPITOR) 10 MG tablet Take 1 tablet (10 mg total) by mouth daily. 03/23/20  Yes [name], MD  Cholecalciferol (VITAMIN D-3 PO) Take by mouth.   Yes [provider]  Clobetasol Propionate 0.15 MG/ACT  (0.05%) LOTN Apply to affected are on hands once per day as needed only. 03/23/20  Yes [name], MD  esomeprazole (NEXIUM) 40 MG capsule Take 40 mg by mouth 2 (two) times daily.   Yes [provider]  triamcinolone (KENALOG) 0.1 % paste Use as directed 1 application in the mouth or throat 2 (two) times daily. Apply to oral ulcer up to twice per day if needed. 05/22/20  Yes [name], MD  triamcinolone cream (KENALOG) 0.1 % Apply 1 application topically 2 (two) times daily. 09/16/19  Yes [name], MD  valACYclovir (VALTREX) 1000 MG tablet Use 2 tablets initially, then 2 in 12 hours for fever blisters 05/22/20  Yes [name], MD   Social History   Socioeconomic History  . Marital status: Married    Spouse name: Not on file  . Number of children: Not on file  . Years of education: Not on file  . Highest education level: Not on file  Occupational History  . Not on file  Tobacco Use  . Smoking status: Passive Smoke Exposure - Never Smoker  . Smokeless tobacco: Never Used  Substance and Sexual Activity  . Alcohol use: Yes    Alcohol/week: 1.0 standard drink    Types: 1 Glasses of wine per week  . Drug use: No  . Sexual activity: Yes  Other Topics  Concern  . Not on file  Social History Narrative  . Not on file   Social Determinants of Health   Financial Resource Strain: Not on file  Food Insecurity: Not on file  Transportation Needs: Not on file  Physical Activity: Not on file  Stress: Not on file  Social Connections: Not on file  Intimate Partner Violence: Not on file    Review of Systems  Constitutional: Negative for fatigue and unexpected weight change.  Eyes: Negative for visual disturbance.  Respiratory: Negative for cough, chest tightness and shortness of breath.   Cardiovascular: Negative for chest pain, palpitations and leg swelling.  Gastrointestinal: Negative for abdominal pain and blood in stool.  Skin: Negative for rash.   Neurological: Negative for dizziness, light-headedness and headaches.     Objective:   Vitals:   09/14/20 1013 09/14/20 1015  BP: (!) 145/88 124/80  Pulse: (!) 53   Temp: 97.6 F (36.4 C)   TempSrc: Temporal   SpO2: 98%   Weight: 182 lb (82.6 kg)   Height: 5\' 9"  (1.753 m)      Physical Exam Vitals reviewed.  Constitutional:      Appearance: He is well-developed and well-nourished.  HENT:     Head: Normocephalic and atraumatic.  Eyes:     Extraocular Movements: EOM normal.     Pupils: Pupils are equal, round, and reactive to light.  Neck:     Vascular: No carotid bruit or JVD.  Cardiovascular:     Rate and Rhythm: Normal rate and regular rhythm.     Heart sounds: Normal heart sounds. No murmur heard.   Pulmonary:     Effort: Pulmonary effort is normal.     Breath sounds: Normal breath sounds. No rales.  Musculoskeletal:        General: No edema.  Skin:    General: Skin is warm and dry.  Neurological:     Mental Status: He is alert and oriented to person, place, and time.  Psychiatric:        Mood and Affect: Mood and affect normal.      Assessment & Plan:  CORBETT MOULDER is a 73 y.o. male . Hyperlipidemia, unspecified hyperlipidemia type - Plan: atorvastatin (LIPITOR) 10 MG tablet, Lipid panel, Comprehensive metabolic panel  -  Stable, tolerating current regimen. Medications refilled. Labs pending as above.   HSV-1 (herpes simplex virus 1) infection - Plan: valACYclovir (VALTREX) 1000 MG tablet  - option of daily dose 500mg , but declined at present. Will continue prn dosing of valtrex for now. 6 month recheck for physical.    Meds ordered this encounter  Medications  . atorvastatin (LIPITOR) 10 MG tablet    Sig: Take 1 tablet (10 mg total) by mouth daily.    Dispense:  90 tablet    Refill:  2  . valACYclovir (VALTREX) 1000 MG tablet    Sig: Use 2 tablets initially, then 2 in 12 hours for fever blisters    Dispense:  8 tablet    Refill:  5    Patient Instructions   Let me know if you would like to try daily dosing of valtrex to prevent cold sores. No med changes today. Take care!    If you have lab work done today you will be contacted with your lab results within the next 2 weeks.  If you have not heard from 65 then please contact . The fastest way to get your results is to register for My Chart.  IF you received an x-ray today, you will receive an invoice from [facility]. Please contact [facility]iology at [phone] with questions or concerns regarding your invoice.   IF you received labwork today, you will receive an invoice from [place]. Please contact [facility] at [phone] with questions or concerns regarding your invoice.   Our billing staff will not be able to assist you with questions regarding bills from these companies.  You will be contacted with the lab results as soon as they are available. The fastest way to get your results is to activate your My Chart account. Instructions are located on the last page of this paperwork. If you have not heard from [place] regarding the results in 2 weeks, please contact this office.          Signed, Merri Ray, MD [facility]

## 2020-09-14 NOTE — Patient Instructions (Addendum)
Let me know if you would like to try daily dosing of valtrex to prevent cold sores. No med changes today. Take care!  If you have lab work done today you will be contacted with your lab results within the next 2 weeks.  If you have not heard from [place] then please contact us. The fastest way to get your results is to register for My Chart.   IF you received an x-ray today, you will receive an invoice from [facility]. Please contact [facility] at [phone] with questions or concerns regarding your invoice.   IF you received labwork today, you will receive an invoice from [place]. Please contact [facility] at [phone] with questions or concerns regarding your invoice.   Our billing staff will not be able to assist you with questions regarding bills from these companies.  You will be contacted with the lab results as soon as they are available. The fastest way to get your results is to activate your My Chart account. Instructions are located on the last page of this paperwork. If you have not heard from [place] regarding the results in 2 weeks, please contact this office.

## 2020-09-15 LAB — COMPREHENSIVE METABOLIC PANEL
ALT: 12 IU/L (ref 0–44)
AST: 18 IU/L (ref 0–40)
Albumin/Globulin Ratio: 1.6 (ref 1.2–2.2)
Albumin: 4.2 g/dL (ref 3.7–4.7)
Alkaline Phosphatase: 95 IU/L (ref 44–121)
BUN/Creatinine Ratio: 11 (ref 10–24)
BUN: 12 mg/dL (ref 8–27)
Bilirubin Total: 0.4 mg/dL (ref 0.0–1.2)
CO2: 24 mmol/L (ref 20–29)
Calcium: 9.4 mg/dL (ref 8.6–10.2)
Chloride: 104 mmol/L (ref 96–106)
Creatinine, Ser: 1.13 mg/dL (ref 0.76–1.27)
GFR calc Af Amer: 74 mL/min/{1.73_m2} (ref >59)
GFR calc non Af Amer: 64 mL/min/{1.73_m2} (ref >59)
Globulin, Total: 2.6 g/dL (ref 1.5–4.5)
Glucose: 90 mg/dL (ref 65–99)
Potassium: 5.1 mmol/L (ref 3.5–5.2)
Sodium: 140 mmol/L (ref 134–144)
Total Protein: 6.8 g/dL (ref 6.0–8.5)

## 2020-09-15 LAB — LIPID PANEL
Chol/HDL Ratio: 2.6 ratio (ref 0.0–5.0)
Cholesterol, Total: 131 mg/dL (ref 100–199)
HDL: 50 mg/dL (ref >39)
LDL Chol Calc (NIH): 66 mg/dL (ref 0–99)
Triglycerides: 72 mg/dL (ref 0–149)
VLDL Cholesterol Cal: 15 mg/dL (ref 5–40)

## 2021-02-28 ENCOUNTER — Encounter: Payer: Self-pay | Admitting: Family Medicine

## 2021-02-28 NOTE — Telephone Encounter (Signed)
Please schedule virtual visit with me at 1 or 4pm on 6/2 to review symptoms and treatment options for covid.

## 2021-03-01 ENCOUNTER — Telehealth (INDEPENDENT_AMBULATORY_CARE_PROVIDER_SITE_OTHER): Payer: Federal, State, Local not specified - PPO | Admitting: Family Medicine

## 2021-03-01 VITALS — Temp 99.4°F

## 2021-03-01 DIAGNOSIS — U071 COVID-19: Secondary | ICD-10-CM | POA: Diagnosis not present

## 2021-03-01 DIAGNOSIS — R059 Cough, unspecified: Secondary | ICD-10-CM

## 2021-03-01 MED ORDER — BENZONATATE 100 MG PO CAPS: 100.0000 mg | ORAL_CAPSULE | Freq: Three times a day (TID) | ORAL | 0 refills | Status: DC | PRN

## 2021-03-01 MED ORDER — PROMETHAZINE-DM 6.25-15 MG/5ML PO SYRP: 2.5000 mL | ORAL_SOLUTION | Freq: Every evening | ORAL | 0 refills | Status: DC | PRN

## 2021-03-01 NOTE — Patient Instructions (Signed)
I am glad to hear that you are improving.  Continue Mucinex, Tylenol if needed, fluids, rest.  Tessalon Perles up to 3 times daily as needed for irritant cough, but I also wrote for some cough syrup at night if needed to help with sleep.  If any worsening symptoms, repeat video visit or urgent care if needed.    Remember to continue to wear a mask for a full 10 days after your initial symptoms or the positive test on Monday is fine as well.  As long as you are improving after 5 days and no fever off of fever reducing medications for at least a full day, then you are considered less contagious and can come out of quarantine/isolation.  Please let me know if there are questions and take care.

## 2021-03-01 NOTE — Telephone Encounter (Signed)
LM asking pt to call back to schedule an appt [facility]  °

## 2021-03-01 NOTE — Progress Notes (Signed)
Virtual Visit via Video Note  I connected with Andrew Wolf on 03/01/21 at 1:26 PM by a video enabled telemedicine application and verified that I am speaking with the correct person using two identifiers.  Patient location:home My location: office - [facility]    I discussed the limitations, risks, security and privacy concerns of performing an evaluation and management service by telephone and the availability of in person appointments. I also discussed with the patient that there may be a patient responsible charge related to this service. The patient expressed understanding and agreed to proceed, consent obtained.  Over 50% of visit on video, but changed to audio after  Chief complaint:  Chief Complaint  Patient presents with  . Covid Positive    Pt tested positive at home on Monday, pt reports took [facility] test as well but no results yet, sore throat, productive cough, congestion, sinus drainage. Pt has had 3 doses of Moderna Vaccine     History of Present Illness: Andrew Wolf is a 75 y.o. male  Symptoms started 1 week ago - Thursday may 26th. Initial sore throat, then cough, congestion, sinus drainage, chills. No change in taste or smell. No dyspnea, just fatigue at times with activities. No specific sick contacts known. No confusion. Positive test on May 30. Feels like improving past few days. Temp 99.9 today. Cough has been easier to bring up phlegm.   Tx: mucinex, tylenol, robitussin. Working well, but wearing off at night. Tickle in back of throat.  COVID risk of complications score of 3.  He has been vaccinated with the Moderna COVID-19 vaccine on November 13, 2019, December 11, 2019, and booster August 07, 2020  Lab Results  Component Value Date   CREATININE 1.13 09/14/2020  eGFR of 74 on 09/14/2020.  He is on statin.   Patient Active Problem List   Diagnosis Date Noted  . GERD (gastroesophageal reflux disease) 02/11/2013   Past Medical History:  Diagnosis  Date  . Allergy   . Anxiety   . Depression   . GERD (gastroesophageal reflux disease)    Past Surgical History:  Procedure Laterality Date  . KNEE SURGERY  2004   No Known Allergies Prior to Admission medications   Medication Sig Start Date End Date Taking? Authorizing Provider  atorvastatin (LIPITOR) 10 MG tablet Take 1 tablet (10 mg total) by mouth daily. 09/14/20  Yes Wendie Agreste, MD  Cholecalciferol (VITAMIN D-3 PO) Take by mouth.   Yes [provider]  Clobetasol Propionate 0.15 MG/ACT (0.05%) LOTN Apply to affected are on hands once per day as needed only. 03/23/20  Yes Wendie Agreste, MD  esomeprazole (NEXIUM) 40 MG capsule Take 40 mg by mouth 2 (two) times daily.   Yes [provider]  triamcinolone (KENALOG) 0.1 % paste Use as directed 1 application in the mouth or throat 2 (two) times daily. Apply to oral ulcer up to twice per day if needed. 05/22/20  Yes Wendie Agreste, MD  triamcinolone cream (KENALOG) 0.1 % Apply 1 application topically 2 (two) times daily. 09/16/19  Yes Wendie Agreste, MD  valACYclovir (VALTREX) 1000 MG tablet Use 2 tablets initially, then 2 in 12 hours for fever blisters 09/14/20  Yes Wendie Agreste, MD   Social History   Socioeconomic History  . Marital status: Married    Spouse name: Not on file  . Number of children: Not on file  . Years of education: Not on file  . Highest  education level: Not on file  Occupational History  . Not on file  Tobacco Use  . Smoking status: Passive Smoke Exposure - Never Smoker  . Smokeless tobacco: Never Used  Substance and Sexual Activity  . Alcohol use: Yes    Alcohol/week: 1.0 standard drink    Types: 1 Glasses of wine per week  . Drug use: No  . Sexual activity: Yes  Other Topics Concern  . Not on file  Social History Narrative  . Not on file   Social Determinants of Health   Financial Resource Strain: Not on file  Food Insecurity: Not on file  Transportation Needs:  Not on file  Physical Activity: Not on file  Stress: Not on file  Social Connections: Not on file  Intimate Partner Violence: Not on file    Observations/Objective: Temp 99.9 today.  Nontoxic appearance on video, no respiratory distress, speaking in full sentences.  Appropriate responses, all questions were answered with understanding the plan expressed.  Assessment and Plan: COVID-19 virus infection  Cough - Plan: promethazine-dextromethorphan (PROMETHAZINE-DM) 6.25-15 MG/5ML syrup, benzonatate (TESSALON) 100 MG capsule Now approximately day 7, unfortunately outside window for antivirals.  However he is improving, staying hydrated, home regimen working fairly well but some persistent cough and nighttime cough.  Add Tessalon Perles 3 times daily as needed, Phenergan DM if needed at night.  Potential side effects discussed.  RTC/video visit or urgent care precautions discussed as well as masking and isolation guidelines.  Understand expressed  Follow Up Instructions:    I discussed the assessment and treatment plan with the patient. The patient was provided an opportunity to ask questions and all were answered. The patient agreed with the plan and demonstrated an understanding of the instructions.   The patient was advised to call back or seek an in-person evaluation if the symptoms worsen or if the condition fails to improve as anticipated.  I provided 20 minutes of non-face-to-face time during this encounter.   Wendie Agreste, MD

## 2021-03-15 ENCOUNTER — Other Ambulatory Visit: Payer: Self-pay

## 2021-03-15 ENCOUNTER — Ambulatory Visit (INDEPENDENT_AMBULATORY_CARE_PROVIDER_SITE_OTHER): Payer: Federal, State, Local not specified - PPO | Admitting: Family Medicine

## 2021-03-15 ENCOUNTER — Encounter: Payer: Self-pay | Admitting: Family Medicine

## 2021-03-15 VITALS — BP 126/74 | HR 55 | Temp 98.2°F | Resp 16 | Ht 69.0 in | Wt 185.4 lb

## 2021-03-15 DIAGNOSIS — K219 Gastro-esophageal reflux disease without esophagitis: Secondary | ICD-10-CM

## 2021-03-15 DIAGNOSIS — Z0000 Encounter for general adult medical examination without abnormal findings: Secondary | ICD-10-CM | POA: Diagnosis not present

## 2021-03-15 DIAGNOSIS — Z131 Encounter for screening for diabetes mellitus: Secondary | ICD-10-CM | POA: Diagnosis not present

## 2021-03-15 DIAGNOSIS — E785 Hyperlipidemia, unspecified: Secondary | ICD-10-CM | POA: Diagnosis not present

## 2021-03-15 LAB — LIPID PANEL
Cholesterol: 126 mg/dL (ref 0–200)
HDL: 44.1 mg/dL (ref >39.00)
LDL Cholesterol: 59 mg/dL (ref 0–99)
NonHDL: 81.86
Total CHOL/HDL Ratio: 3
Triglycerides: 116 mg/dL (ref 0.0–149.0)
VLDL: 23.2 mg/dL (ref 0.0–40.0)

## 2021-03-15 LAB — COMPREHENSIVE METABOLIC PANEL WITH GFR
ALT: 16 U/L (ref 0–53)
AST: 16 U/L (ref 0–37)
Albumin: 3.9 g/dL (ref 3.5–5.2)
Alkaline Phosphatase: 89 U/L (ref 39–117)
BUN: 13 mg/dL (ref 6–23)
CO2: 29 meq/L (ref 19–32)
Calcium: 9.3 mg/dL (ref 8.4–10.5)
Chloride: 103 meq/L (ref 96–112)
Creatinine, Ser: 1.14 mg/dL (ref 0.40–1.50)
GFR: 63.45 mL/min
Glucose, Bld: 92 mg/dL (ref 70–99)
Potassium: 4.9 meq/L (ref 3.5–5.1)
Sodium: 139 meq/L (ref 135–145)
Total Bilirubin: 0.6 mg/dL (ref 0.2–1.2)
Total Protein: 6.5 g/dL (ref 6.0–8.3)

## 2021-03-15 LAB — HEMOGLOBIN A1C: Hgb A1c MFr Bld: 5.4 % (ref 4.6–6.5)

## 2021-03-15 MED ORDER — ATORVASTATIN CALCIUM 10 MG PO TABS: 10.0000 mg | ORAL_TABLET | Freq: Every day | ORAL | 2 refills | Status: DC

## 2021-03-15 NOTE — Progress Notes (Signed)
Subjective:  Patient ID: Andrew Wolf, male    DOB: 10-19-1945  Age: 75 y.o. MRN: [id number]  CC:  Chief Complaint  Patient presents with   Medicare Wellness    Pt here for physical, doing okay today no concerns,     HPI Andrew Wolf presents for  Presents for annual wellness exam.   Care team: PCP: me [id number] - 07/2020 - GERD, globus sensation.  GI: [facility]: Alvester Morin  - followed for prostate iregularity, had negative bx. Saw urology about 9 months ago - urinating ok.   Recent covid infection  - video visit 03/01/21. Onset 1 week prior. Gettting better daily, but still some fatigue, min congestion. Improving.   Hyperlipidemia: Lipitor 10 mg daily.no new se's or myalgias.  Lab Results  Component Value Date   CHOL 131 09/14/2020   HDL 50 09/14/2020   LDLCALC 66 09/14/2020   TRIG 72 09/14/2020   CHOLHDL 2.6 09/14/2020   Lab Results  Component Value Date   ALT 12 09/14/2020   AST 18 09/14/2020   ALKPHOS 95 09/14/2020   BILITOT 0.4 09/14/2020   GERD Chronic, treated with Nexium.  Evaluated with ENT last year for globus sensation, normal laryngoscopy.  Mild posterior glottic erythema consistent with chronic reflux.  No mass or tumor.  Continued reflux precautions, twice daily PPI.  Doing well on BID PPI- no breakthrough heartburn. Will be seeing Dr. Matthias Hughs in next year.   Fall screening Fall Risk  03/15/2021 09/14/2020 05/22/2020 03/23/2020 09/16/2019  Falls in the past year? 0 0 0 0 0  Number falls in past yr: - - - - 0  Injury with Fall? - - - - 0  Follow up Falls evaluation completed Falls evaluation completed Falls evaluation completed Falls evaluation completed Falls evaluation completed   Lighting in home: adequate.  Loose rugs/carpets/pets: cat at home - not near feet.  Stairs: none.  Grab bars in bathroom: has grab bars.   Depression Screening: Depression screen [facility] 2/9 03/15/2021 09/14/2020 03/23/2020 09/16/2019 03/08/2019  Decreased Interest 0 0 0  0 0  Down, Depressed, Hopeless 0 0 0 0 0  PHQ - 2 Score 0 0 0 0 0  Altered sleeping 0 - - - -  Tired, decreased energy 0 - - - -  Change in appetite 0 - - - -  Feeling bad or failure about yourself  0 - - - -  Trouble concentrating 0 - - - -  Moving slowly or fidgety/restless 0 - - - -  Suicidal thoughts 0 - - - -  PHQ-9 Score 0 - - - -    Cancer Screening: Colonoscopy/endoscopy, followed by Dr. Matthias Hughs.  Colonoscopy March 2017. Followed by urology for prostate.   Immunization History  Administered Date(s) Administered   Influenza Split 07/10/2012   Influenza, High Dose Seasonal PF 08/28/2017, 08/24/2018, 06/17/2019   Influenza,inj,Quad PF,6+ Mos 07/15/2013, 07/16/2016   Influenza-Unspecified 08/06/2019, 05/05/2020   Moderna Sars-Covid-2 Vaccination 11/13/2019, 12/11/2019, 08/07/2020   Pneumococcal Conjugate-13 04/14/2018   Pneumococcal Polysaccharide-23 08/19/2013   Tdap 08/19/2013  Shingles vaccine - plan at pharmacy.  COVID-19 vaccine in February, March 2021, booster in November 2021.  Second booster - recently had infection - plans to delay 3 months.   Functional Status Survey:    Memory Screen: 6CIT Screen 03/15/2021 04/14/2018  What Year? 0 points 0 points  What month? 0 points 0 points  What time? 0 points 0 points  Count back from 20 0 points 0  points  Months in reverse 0 points 0 points  Repeat phrase 0 points 0 points  Total Score 0 0    Alcohol Screening: Flowsheet Row Office Visit from 03/15/2021 in [facility]  AUDIT-C Score 0       Vision Screening   Right eye Left eye Both eyes  Without correction     With correction 20/13-1 20/13-1 20/13   Optho/optometry:once per year appts. Wears glasses.   Dental: every 6 months.   Exercise: some exercise- most days per week - yardwork.   Advanced Directives: Has advanced directive with living will and HCPOA -  declines changes at this time - plans to give me a copy  once finalized by atty.   History Patient Active Problem List   Diagnosis Date Noted   GERD (gastroesophageal reflux disease) 02/11/2013   Past Medical History:  Diagnosis Date   Allergy    Anxiety    Depression    GERD (gastroesophageal reflux disease)    Past Surgical History:  Procedure Laterality Date   KNEE SURGERY  2004   No Known Allergies Prior to Admission medications   Medication Sig Start Date End Date Taking? Authorizing Provider  Cholecalciferol (VITAMIN D-3 PO) Take by mouth.   Yes [provider]  Clobetasol Propionate 0.15 MG/ACT (0.05%) LOTN Apply to affected are on hands once per day as needed only. 03/23/20  Yes [name], MD  esomeprazole (NEXIUM) 40 MG capsule Take 40 mg by mouth 2 (two) times daily.   Yes [provider]  triamcinolone (KENALOG) 0.1 % paste Use as directed 1 application in the mouth or throat 2 (two) times daily. Apply to oral ulcer up to twice per day if needed. 05/22/20  Yes [name], MD  triamcinolone cream (KENALOG) 0.1 % Apply 1 application topically 2 (two) times daily. 09/16/19  Yes [name], MD  valACYclovir (VALTREX) 1000 MG tablet Use 2 tablets initially, then 2 in 12 hours for fever blisters 09/14/20  Yes [name], MD  atorvastatin (LIPITOR) 10 MG tablet Take 1 tablet (10 mg total) by mouth daily. 09/14/20   [name], MD   Social History   Socioeconomic History   Marital status: Married    Spouse name: Not on file   Number of children: Not on file   Years of education: Not on file   Highest education level: Not on file  Occupational History   Not on file  Tobacco Use   Smoking status: Never    Passive exposure: Yes   Smokeless tobacco: Never  Substance and Sexual Activity   Alcohol use: Yes    Alcohol/week: 1.0 standard drink    Types: 1 Glasses of wine per week    Comment: rare   Drug use: No   Sexual activity: Yes  Other Topics Concern   Not on file   Social History Narrative   Not on file   Social Determinants of Health   Financial Resource Strain: Not on file  Food Insecurity: Not on file  Transportation Needs: Not on file  Physical Activity: Not on file  Stress: Not on file  Social Connections: Not on file  Intimate Partner Violence: Not on file    Review of Systems Per HPI.   Objective:   Vitals:   03/15/21 1008  BP: 126/74  Pulse: (!) 55  Resp: 16  Temp: 98.2 F (36.8 C)  TempSrc: Temporal  SpO2: 96%  Weight: 185  lb 6.4 oz (84.1 kg)  Height: 5\' 9"  (1.753 m)     Physical Exam Vitals reviewed.  Constitutional:      Appearance: He is well-developed.  HENT:     Head: Normocephalic and atraumatic.     Right Ear: External ear normal.     Left Ear: External ear normal.  Eyes:     Conjunctiva/sclera: Conjunctivae normal.     Pupils: Pupils are equal, round, and reactive to light.  Neck:     Thyroid: No thyromegaly.  Cardiovascular:     Rate and Rhythm: Normal rate and regular rhythm.     Heart sounds: Normal heart sounds.  Pulmonary:     Effort: Pulmonary effort is normal. No respiratory distress.     Breath sounds: Normal breath sounds. No wheezing.  Abdominal:     General: There is no distension.     Palpations: Abdomen is soft.     Tenderness: There is no abdominal tenderness.  Musculoskeletal:        General: No tenderness. Normal range of motion.     Cervical back: Normal range of motion and neck supple.  Lymphadenopathy:     Cervical: No cervical adenopathy.  Skin:    General: Skin is warm and dry.  Neurological:     Mental Status: He is alert and oriented to person, place, and time.     Deep Tendon Reflexes: Reflexes are normal and symmetric.  Psychiatric:        Behavior: Behavior normal.       Assessment & Plan:  HOLMES HAYS is a 75 y.o. male . Encounter for Medicare annual wellness exam  - - anticipatory guidance as below in AVS, screening labs if needed. Health maintenance  items as above in HPI discussed/recommended as applicable.  - no concerning responses on depression, fall, or functional status screening. Any positive responses noted as above. Advanced directives discussed as in CHL.   -Although slow improvement, has been improving since his COVID-19 infection without recent fever or concerning respiratory symptoms.  Anticipate continued improvement.  RTC precautions given  -Keep follow-up with specialist as planned including gastroenterology and urology.  -Shingles vaccine planned at pharmacy.  Hyperlipidemia, unspecified hyperlipidemia type - Plan: Comprehensive metabolic panel, Lipid panel  -Tolerating current regimen, continue same, check labs  Gastroesophageal reflux disease, unspecified whether esophagitis present  -Stable with twice daily PPI, continue follow-up with gastroenterology.  Screening for diabetes mellitus - Plan: Hemoglobin A1c   No orders of the defined types were placed in this encounter.  Patient Instructions  No change in medications for now.  Recheck in 6 months.  I expect the COVID-19 infection symptoms to continue to improve but if any worsening symptoms or not improving, please let me know.  Take care.      Signed,   67, MD [facility], [facility] 03/15/21 11:10 AM

## 2021-03-15 NOTE — Patient Instructions (Signed)
No change in medications for now.  Recheck in 6 months.  I expect the COVID-19 infection symptoms to continue to improve but if any worsening symptoms or not improving, please let me know.  Take care.

## 2021-09-17 ENCOUNTER — Other Ambulatory Visit: Payer: Self-pay

## 2021-09-17 ENCOUNTER — Encounter: Payer: Self-pay | Admitting: Family Medicine

## 2021-09-17 ENCOUNTER — Ambulatory Visit (INDEPENDENT_AMBULATORY_CARE_PROVIDER_SITE_OTHER): Payer: Federal, State, Local not specified - PPO | Admitting: Family Medicine

## 2021-09-17 VITALS — BP 140/83 | HR 64 | Temp 98.3°F | Resp 18 | Ht 69.0 in | Wt 186.8 lb

## 2021-09-17 DIAGNOSIS — E785 Hyperlipidemia, unspecified: Secondary | ICD-10-CM | POA: Diagnosis not present

## 2021-09-17 DIAGNOSIS — K219 Gastro-esophageal reflux disease without esophagitis: Secondary | ICD-10-CM

## 2021-09-17 DIAGNOSIS — L309 Dermatitis, unspecified: Secondary | ICD-10-CM | POA: Diagnosis not present

## 2021-09-17 DIAGNOSIS — Z5181 Encounter for therapeutic drug level monitoring: Secondary | ICD-10-CM

## 2021-09-17 DIAGNOSIS — B351 Tinea unguium: Secondary | ICD-10-CM

## 2021-09-17 MED ORDER — TRIAMCINOLONE ACETONIDE 0.1 % EX CREA: 1.0000 "application " | TOPICAL_CREAM | Freq: Two times a day (BID) | CUTANEOUS | 2 refills | Status: AC

## 2021-09-17 MED ORDER — ATORVASTATIN CALCIUM 10 MG PO TABS: 10.0000 mg | ORAL_TABLET | Freq: Every day | ORAL | 2 refills | Status: DC

## 2021-09-17 MED ORDER — TERBINAFINE HCL 250 MG PO TABS: 250.0000 mg | ORAL_TABLET | Freq: Every day | ORAL | 0 refills | Status: DC

## 2021-09-17 NOTE — Progress Notes (Signed)
Subjective:  Patient ID: Andrew Wolf, male    DOB: 11/01/1945  Age: 75 y.o. MRN: [id number]  CC:  Chief Complaint  Patient presents with   Follow-up    Patient states he is here for a 6 month follow up for medication review .    HPI Andrew Wolf presents for   Hyperlipidemia: Lipitor 10 mg daily.  No new side effects or myalgias.  Last LDL stable in June. Fasting today.  Lab Results  Component Value Date   CHOL 126 03/15/2021   HDL 44.10 03/15/2021   LDLCALC 59 03/15/2021   TRIG 116.0 03/15/2021   CHOLHDL 3 03/15/2021   Lab Results  Component Value Date   ALT 16 03/15/2021   AST 16 03/15/2021   ALKPHOS 89 03/15/2021   BILITOT 0.6 03/15/2021    GERD Previously evaluated by ENT with globus sensation,laryngoscopy with mild posterior glottic erythema consistent with chronic reflux but no mass or tumor.  Takes Nexium, twice per day.  Has been followed by gastroenterology, Dr. Matthias Hughs previously. Doing ok with current regimen.   History of hand dermatitis Treated with hydrating lotion previously, as well as triamcinolone twice daily as needed. Using steroid once per day, more if washing more.  Aveeno.  Toenail fungus: Past year. Tried multiple otc meds. No relief. No pain. Thickened nails.   Health maintenance Up-to-date.   History Patient Active Problem List   Diagnosis Date Noted   GERD (gastroesophageal reflux disease) 02/11/2013   Past Medical History:  Diagnosis Date   Allergy    Anxiety    Depression    GERD (gastroesophageal reflux disease)    Past Surgical History:  Procedure Laterality Date   KNEE SURGERY  2004   Not on File Prior to Admission medications   Medication Sig Start Date End Date Taking? Authorizing Provider  atorvastatin (LIPITOR) 10 MG tablet Take 1 tablet (10 mg total) by mouth daily. 03/15/21  Yes [name], MD  Cholecalciferol (VITAMIN D-3 PO) Take by mouth.   Yes [provider]  Clobetasol Propionate 0.15  MG/ACT (0.05%) LOTN Apply to affected are on hands once per day as needed only. 03/23/20  Yes [name], MD  esomeprazole (NEXIUM) 40 MG capsule Take 40 mg by mouth 2 (two) times daily.   Yes [provider]  triamcinolone (KENALOG) 0.1 % paste Use as directed 1 application in the mouth or throat 2 (two) times daily. Apply to oral ulcer up to twice per day if needed. 05/22/20  Yes [name], MD  triamcinolone cream (KENALOG) 0.1 % Apply 1 application topically 2 (two) times daily. 09/16/19  Yes [name], MD  valACYclovir (VALTREX) 1000 MG tablet Use 2 tablets initially, then 2 in 12 hours for fever blisters 09/14/20  Yes [name], MD   Social History   Socioeconomic History   Marital status: Married    Spouse name: Not on file   Number of children: Not on file   Years of education: Not on file   Highest education level: Not on file  Occupational History   Not on file  Tobacco Use   Smoking status: Never    Passive exposure: Yes   Smokeless tobacco: Never  Substance and Sexual Activity   Alcohol use: Yes    Alcohol/week: 1.0 standard drink    Types: 1 Glasses of wine per week    Comment: rare   Drug use: No   Sexual activity: Yes  Other Topics Concern   Not on file  Social History Narrative   Not on file   Social Determinants of Health   Financial Resource Strain: Not on file  Food Insecurity: Not on file  Transportation Needs: Not on file  Physical Activity: Not on file  Stress: Not on file  Social Connections: Not on file  Intimate Partner Violence: Not on file    Review of Systems  Constitutional:  Negative for fatigue and unexpected weight change.  Eyes:  Negative for visual disturbance.  Respiratory:  Negative for cough, chest tightness and shortness of breath.   Cardiovascular:  Negative for chest pain, palpitations and leg swelling.  Gastrointestinal:  Negative for abdominal pain and blood in stool.  Neurological:   Negative for dizziness, light-headedness and headaches.    Objective:   Vitals:   09/17/21 1014  BP: 140/83  Pulse: 64  Resp: 18  Temp: 98.3 F (36.8 C)  TempSrc: Temporal  SpO2: 100%  Weight: 186 lb 12.8 oz (84.7 kg)  Height: 5\' 9"  (1.753 m)     Physical Exam Vitals reviewed.  Constitutional:      Appearance: He is well-developed.  HENT:     Head: Normocephalic and atraumatic.  Neck:     Vascular: No carotid bruit or JVD.  Cardiovascular:     Rate and Rhythm: Normal rate and regular rhythm.     Heart sounds: Normal heart sounds. No murmur heard. Pulmonary:     Effort: Pulmonary effort is normal.     Breath sounds: Normal breath sounds. No rales.  Musculoskeletal:     Right lower leg: No edema.     Left lower leg: No edema.  Skin:    General: Skin is warm and dry.     Comments: Thickened discolored great toenails bilaterally, slight thickening of the fifth toenail on the left, minimal thickening distal right fifth toenail.  No surrounding skin changes  Slightly thickened, dry skin of the proximal palms bilaterally, right greater than left.  No erythema or wounds.  Neurological:     Mental Status: He is alert and oriented to person, place, and time.  Psychiatric:        Mood and Affect: Mood normal.       Assessment & Plan:  Andrew Wolf is a 75 y.o. male . Hyperlipidemia, unspecified hyperlipidemia type - Plan: atorvastatin (LIPITOR) 10 MG tablet  -Tolerating Lipitor, continue same, check labs.  Gastroesophageal reflux disease, unspecified whether esophagitis present  -Stable with twice daily Nexium.  Continue follow-up with gastroenterology.  Hand dermatitis - Plan: triamcinolone cream (KENALOG) 0.1 %  -Eucerin or Aveeno for hydrating lotion, triamcinolone if needed for hand eczema.  Onychomycosis - Plan: terbinafine (LAMISIL) 250 MG tablet, Hepatic function panel  -New concern.  Failed outpatient/over-the-counter treatments.  Option of topical versus  Lamisil discussed.  Chose Lamisil, ongoing liver test monitoring discussed.  Check LFTs today and in 6 weeks, follow-up in 12 weeks if not resolved.  Medication monitoring encounter - Plan: Hepatic function panel   Meds ordered this encounter  Medications   terbinafine (LAMISIL) 250 MG tablet    Sig: Take 1 tablet (250 mg total) by mouth daily.    Dispense:  90 tablet    Refill:  0   triamcinolone cream (KENALOG) 0.1 %    Sig: Apply 1 application topically 2 (two) times daily.    Dispense:  30 g    Refill:  2   atorvastatin (LIPITOR) 10 MG tablet  Sig: Take 1 tablet (10 mg total) by mouth daily.    Dispense:  90 tablet    Refill:  2   Patient Instructions  No change in Nexium or Lipitor today.  Eucerin or Aveeno lotion for hand dermatitis.  Triamcinolone cream up to twice per day as needed.  Lamisil once per day for toenail fungus.  Follow-up for a lab only visit in 6 weeks, then follow-up with me in 12 weeks if the toenail fungus is not improved.  Thanks for coming in today.  Take care.     If you have lab work done today you will be contacted with your lab results within the next 2 weeks.  If you have not heard from [place] then please contact us. The fastest way to get your results is to register for My Chart.   IF you received an x-ray today, you will receive an invoice from [facility]. Please contact [facility] at [phone] with questions or concerns regarding your invoice.   IF you received labwork today, you will receive an invoice from [place] [facility]. Please contact [facility] at [phone] with questions or concerns regarding your invoice.   Our billing staff will not be able to assist you with questions regarding bills from these companies.  You will be contacted with the lab results as soon as they are available. The fastest way to get your results is to activate your My Chart account. Instructions are located on the last page of this paperwork. If you  have not heard from [place] regarding the results in 2 weeks, please contact this office.        Signed,   [name], MD [facility], [facility] 09/17/21 10:54 AM

## 2021-09-17 NOTE — Patient Instructions (Addendum)
No change in Nexium or Lipitor today.  Eucerin or Aveeno lotion for hand dermatitis.  Triamcinolone cream up to twice per day as needed.  Lamisil once per day for toenail fungus.  Follow-up for a lab only visit in 6 weeks, then follow-up with me in 12 weeks if the toenail fungus is not improved.  Thanks for coming in today.  Take care.     If you have lab work done today you will be contacted with your lab results within the next 2 weeks.  If you have not heard from [place] then please contact us. The fastest way to get your results is to register for My Chart.   IF you received an x-ray today, you will receive an invoice from [facility]. Please contact [facility] at [phone] with questions or concerns regarding your invoice.   IF you received labwork today, you will receive an invoice from [place]. Please contact [facility] at [phone] with questions or concerns regarding your invoice.   Our billing staff will not be able to assist you with questions regarding bills from these companies.  You will be contacted with the lab results as soon as they are available. The fastest way to get your results is to activate your My Chart account. Instructions are located on the last page of this paperwork. If you have not heard from [place] regarding the results in 2 weeks, please contact this office.

## 2021-10-29 ENCOUNTER — Other Ambulatory Visit (INDEPENDENT_AMBULATORY_CARE_PROVIDER_SITE_OTHER): Payer: Federal, State, Local not specified - PPO

## 2021-10-29 DIAGNOSIS — B351 Tinea unguium: Secondary | ICD-10-CM

## 2021-10-29 DIAGNOSIS — Z5181 Encounter for therapeutic drug level monitoring: Secondary | ICD-10-CM | POA: Diagnosis not present

## 2021-10-29 LAB — HEPATIC FUNCTION PANEL
ALT: 15 U/L (ref 0–53)
AST: 16 U/L (ref 0–37)
Albumin: 4.1 g/dL (ref 3.5–5.2)
Alkaline Phosphatase: 92 U/L (ref 39–117)
Bilirubin, Direct: 0.1 mg/dL (ref 0.0–0.3)
Total Bilirubin: 0.5 mg/dL (ref 0.2–1.2)
Total Protein: 6.7 g/dL (ref 6.0–8.3)

## 2021-11-03 ENCOUNTER — Other Ambulatory Visit: Payer: Self-pay | Admitting: Family Medicine

## 2021-11-03 DIAGNOSIS — B351 Tinea unguium: Secondary | ICD-10-CM

## 2021-11-03 MED ORDER — TERBINAFINE HCL 250 MG PO TABS: 250.0000 mg | ORAL_TABLET | Freq: Every day | ORAL | 0 refills | Status: DC

## 2021-11-03 NOTE — Progress Notes (Signed)
Refilled lamisil if needed - recent lfts ok.

## 2021-12-17 ENCOUNTER — Other Ambulatory Visit: Payer: Self-pay

## 2021-12-17 ENCOUNTER — Telehealth: Payer: Self-pay

## 2021-12-17 DIAGNOSIS — Z5181 Encounter for therapeutic drug level monitoring: Secondary | ICD-10-CM

## 2021-12-17 DIAGNOSIS — B351 Tinea unguium: Secondary | ICD-10-CM

## 2021-12-17 NOTE — Telephone Encounter (Signed)
Encourage patient to contact the pharmacy for refills or they can request refills through [facility] ? ?(Please schedule appointment if patient has not been seen in over a year) ? ? ? ?WHAT PHARMACY WOULD THEY LIKE THIS SENT TO: [facility] - [place], [place] - [address]  ?[address] [place] [address]  ? ?MEDICATION NAME & DOSE:terbinafine (LAMISIL) 250 MG tablet  ? ?NOTES/COMMENTS FROM PATIENT: ? ? ? ? ? ?Front office please notify patient: ?It takes 48-72 hours to process rx refill requests ?Ask patient to call pharmacy to ensure rx is ready before heading there.  ? ?

## 2021-12-17 NOTE — Telephone Encounter (Signed)
Needs updated lft's then if normal can refill for 6 weeks, but will need to be seen if still requiring meds after that point. Schedule lab visit. Orders placed.  ?

## 2021-12-17 NOTE — Telephone Encounter (Signed)
Okay to refill? 

## 2021-12-18 NOTE — Telephone Encounter (Signed)
Left a message for the patient to return a call to the office.

## 2021-12-19 ENCOUNTER — Ambulatory Visit (INDEPENDENT_AMBULATORY_CARE_PROVIDER_SITE_OTHER): Payer: Federal, State, Local not specified - PPO | Admitting: Family Medicine

## 2021-12-19 ENCOUNTER — Encounter: Payer: Self-pay | Admitting: Family Medicine

## 2021-12-19 DIAGNOSIS — B351 Tinea unguium: Secondary | ICD-10-CM

## 2021-12-19 MED ORDER — TERBINAFINE HCL 250 MG PO TABS: 250.0000 mg | ORAL_TABLET | Freq: Every day | ORAL | 0 refills | Status: DC

## 2021-12-19 NOTE — Patient Instructions (Addendum)
Continue lamisil daily.  ?Lab visit in next week. Take care! ?[facility] ?Walk in 8:30-4:30 during weekdays, no appointment needed ?[address]  ?[place], [place] [address] ? ?

## 2021-12-19 NOTE — Progress Notes (Signed)
? ?Subjective:  ?Patient ID: Andrew Wolf, male    DOB: 12-06-1945  Age: 76 y.o. MRN: [id number] ? ?CC:  ?Chief Complaint  ?Patient presents with  ? Medication Refill  ?  Patient requests a refill on Terbinafine  ? ? ?HPI ?Andrew Wolf presents for  ? ?Onychomycosis: ?Toenail fungus. Discussed 09/17/21 - lamisil 250mg  qd.  ?No new side effects. New part of proximal nails are clear - healing.  ?Ran out of meds few days ago.  ?Lab Results  ?Component Value Date  ? ALT 15 10/29/2021  ? AST 16 10/29/2021  ? ALKPHOS 92 10/29/2021  ? BILITOT 0.5 10/29/2021  ? ? ? ?History ?Patient Active Problem List  ? Diagnosis Date Noted  ? GERD (gastroesophageal reflux disease) 02/11/2013  ? ?Past Medical History:  ?Diagnosis Date  ? Allergy   ? Anxiety   ? Depression   ? GERD (gastroesophageal reflux disease)   ? ?Past Surgical History:  ?Procedure Laterality Date  ? KNEE SURGERY  2004  ? ?No Known Allergies ?Prior to Admission medications   ?Medication Sig Start Date End Date Taking? Authorizing Provider  ?atorvastatin (LIPITOR) 10 MG tablet Take 1 tablet (10 mg total) by mouth daily. 09/17/21  Yes Wendie Agreste, MD  ?Cholecalciferol (VITAMIN D-3 PO) Take 1,000 Units by mouth daily.   Yes [provider]  ?Clobetasol Propionate 0.15 MG/ACT (0.05%) LOTN Apply to affected are on hands once per day as needed only. 03/23/20  Yes Wendie Agreste, MD  ?esomeprazole (NEXIUM) 40 MG capsule Take 40 mg by mouth daily.   Yes [provider]  ?Famotidine (PEPCID PO) Take by mouth every morning.   Yes [provider]  ?terbinafine (LAMISIL) 250 MG tablet Take 1 tablet (250 mg total) by mouth daily. 11/03/21  Yes Wendie Agreste, MD  ?triamcinolone (KENALOG) 0.1 % paste Use as directed 1 application in the mouth or throat 2 (two) times daily. Apply to oral ulcer up to twice per day if needed. 05/22/20  Yes Wendie Agreste, MD  ?triamcinolone cream (KENALOG) 0.1 % Apply 1 application topically 2 (two) times  daily. 09/17/21  Yes Wendie Agreste, MD  ?valACYclovir (VALTREX) 1000 MG tablet Use 2 tablets initially, then 2 in 12 hours for fever blisters 09/14/20  Yes Wendie Agreste, MD  ? ?Social History  ? ?Socioeconomic History  ? Marital status: Married  ?  Spouse name: Not on file  ? Number of children: Not on file  ? Years of education: Not on file  ? Highest education level: Not on file  ?Occupational History  ? Not on file  ?Tobacco Use  ? Smoking status: Never  ?  Passive exposure: Yes  ? Smokeless tobacco: Never  ?Substance and Sexual Activity  ? Alcohol use: Yes  ?  Alcohol/week: 1.0 standard drink  ?  Types: 1 Glasses of wine per week  ?  Comment: rare  ? Drug use: No  ? Sexual activity: Yes  ?Other Topics Concern  ? Not on file  ?Social History Narrative  ? Not on file  ? ?Social Determinants of Health  ? ?Financial Resource Strain: Not on file  ?Food Insecurity: Not on file  ?Transportation Needs: Not on file  ?Physical Activity: Not on file  ?Stress: Not on file  ?Social Connections: Not on file  ?Intimate Partner Violence: Not on file  ? ? ?Review of Systems ?Per HPI ? ?Objective:  ? ?Vitals:  ? 12/19/21 1202  ?BP:  110/70  ?Pulse: 60  ?Temp: 98 ?F (36.7 ?C)  ?TempSrc: Oral  ?SpO2: 99%  ?Weight: 188 lb 6.4 oz (85.5 kg)  ?Height: 5\' 9"  (1.753 m)  ? ? ? ?Physical Exam ?Constitutional:   ?   General: He is not in acute distress. ?   Appearance: Normal appearance. He is well-developed.  ?HENT:  ?   Head: Normocephalic and atraumatic.  ?Cardiovascular:  ?   Rate and Rhythm: Normal rate.  ?Pulmonary:  ?   Effort: Pulmonary effort is normal.  ?Skin: ?   Comments: See photo - improving discoloration of nails.   ?Neurological:  ?   Mental Status: He is alert and oriented to person, place, and time.  ?Psychiatric:     ?   Mood and Affect: Mood normal.  ? ? ? ? ? ? ?Assessment & Plan:  ?Andrew Wolf is a 76 y.o. male . ?Onychomycosis - Plan: terbinafine (LAMISIL) 250 MG tablet, Hepatic function panel ?Improving,  continue Lamisil, check LFTs at lab only visit within the next 1 week.  19-month follow-up for review of other meds, labs. ? ?Meds ordered this encounter  ?Medications  ? terbinafine (LAMISIL) 250 MG tablet  ?  Sig: Take 1 tablet (250 mg total) by mouth daily.  ?  Dispense:  90 tablet  ?  Refill:  0  ? ?Patient Instructions  ?Continue lamisil daily.  ?Lab visit in next week. Take care! ?[facility] ?Walk in 8:30-4:30 during weekdays, no appointment needed ?[place]  ?[place], [place] [address] ? ? ? ? ?Signed,  ? ?Merri Ray, MD ?[place] ?[place] ?12/19/21 ?12:41 PM ? ? ?

## 2021-12-21 ENCOUNTER — Other Ambulatory Visit (INDEPENDENT_AMBULATORY_CARE_PROVIDER_SITE_OTHER): Payer: Federal, State, Local not specified - PPO

## 2021-12-21 DIAGNOSIS — Z5181 Encounter for therapeutic drug level monitoring: Secondary | ICD-10-CM

## 2021-12-21 LAB — HEPATIC FUNCTION PANEL
ALT: 11 U/L (ref 0–53)
AST: 16 U/L (ref 0–37)
Albumin: 4 g/dL (ref 3.5–5.2)
Alkaline Phosphatase: 76 U/L (ref 39–117)
Bilirubin, Direct: 0.1 mg/dL (ref 0.0–0.3)
Total Bilirubin: 0.4 mg/dL (ref 0.2–1.2)
Total Protein: 6.5 g/dL (ref 6.0–8.3)

## 2022-03-20 ENCOUNTER — Ambulatory Visit (INDEPENDENT_AMBULATORY_CARE_PROVIDER_SITE_OTHER): Payer: Federal, State, Local not specified - PPO | Admitting: Family Medicine

## 2022-03-20 ENCOUNTER — Encounter: Payer: Self-pay | Admitting: Family Medicine

## 2022-03-20 VITALS — BP 128/70 | HR 58 | Temp 98.3°F | Resp 16 | Ht 69.0 in | Wt 184.3 lb

## 2022-03-20 DIAGNOSIS — B009 Herpesviral infection, unspecified: Secondary | ICD-10-CM | POA: Diagnosis not present

## 2022-03-20 DIAGNOSIS — B351 Tinea unguium: Secondary | ICD-10-CM

## 2022-03-20 DIAGNOSIS — Z0000 Encounter for general adult medical examination without abnormal findings: Secondary | ICD-10-CM | POA: Diagnosis not present

## 2022-03-20 DIAGNOSIS — E785 Hyperlipidemia, unspecified: Secondary | ICD-10-CM

## 2022-03-20 LAB — LIPID PANEL
Cholesterol: 151 mg/dL (ref 0–200)
HDL: 51.2 mg/dL (ref >39.00)
LDL Cholesterol: 77 mg/dL (ref 0–99)
NonHDL: 100.16
Total CHOL/HDL Ratio: 3
Triglycerides: 114 mg/dL (ref 0.0–149.0)
VLDL: 22.8 mg/dL (ref 0.0–40.0)

## 2022-03-20 LAB — COMPREHENSIVE METABOLIC PANEL
ALT: 18 U/L (ref 0–53)
AST: 20 U/L (ref 0–37)
Albumin: 4.2 g/dL (ref 3.5–5.2)
Alkaline Phosphatase: 91 U/L (ref 39–117)
BUN: 13 mg/dL (ref 6–23)
CO2: 30 mEq/L (ref 19–32)
Calcium: 9.6 mg/dL (ref 8.4–10.5)
Chloride: 101 mEq/L (ref 96–112)
Creatinine, Ser: 1.21 mg/dL (ref 0.40–1.50)
GFR: 58.65 mL/min — ABNORMAL LOW (ref >60.00)
Glucose, Bld: 96 mg/dL (ref 70–99)
Potassium: 4.8 mEq/L (ref 3.5–5.1)
Sodium: 136 mEq/L (ref 135–145)
Total Bilirubin: 0.5 mg/dL (ref 0.2–1.2)
Total Protein: 7.4 g/dL (ref 6.0–8.3)

## 2022-03-20 MED ORDER — VALACYCLOVIR HCL 1 G PO TABS: ORAL_TABLET | ORAL | 5 refills | Status: DC

## 2022-03-20 MED ORDER — ATORVASTATIN CALCIUM 10 MG PO TABS: 10.0000 mg | ORAL_TABLET | Freq: Every day | ORAL | 2 refills | Status: DC

## 2022-03-20 MED ORDER — TERBINAFINE HCL 250 MG PO TABS: 250.0000 mg | ORAL_TABLET | Freq: Every day | ORAL | 0 refills | Status: DC

## 2022-03-20 NOTE — Patient Instructions (Signed)
Ok for repeat covid booster and shingles vaccine at your pharmacy.  No med changes today, will extend lamisil for a few more months.  Take care!  Preventive Care 23 Years and Older, Male Preventive care refers to lifestyle choices and visits with your health care provider that can promote health and wellness. Preventive care visits are also called wellness exams. What can I expect for my preventive care visit? Counseling During your preventive care visit, your health care provider may ask about your: Medical history, including: Past medical problems. Family medical history. History of falls. Current health, including: Emotional well-being. Home life and relationship well-being. Sexual activity. Memory and ability to understand (cognition). Lifestyle, including: Alcohol, nicotine or tobacco, and drug use. Access to firearms. Diet, exercise, and sleep habits. Work and work Astronomer. Sunscreen use. Safety issues such as seatbelt and bike helmet use. Physical exam Your health care provider will check your: Height and weight. These may be used to calculate your BMI (body mass index). BMI is a measurement that tells if you are at a healthy weight. Waist circumference. This measures the distance around your waistline. This measurement also tells if you are at a healthy weight and may help predict your risk of certain diseases, such as type 2 diabetes and high blood pressure. Heart rate and blood pressure. Body temperature. Skin for abnormal spots. What immunizations do I need?  Vaccines are usually given at various ages, according to a schedule. Your health care provider will recommend vaccines for you based on your age, medical history, and lifestyle or other factors, such as travel or where you work. What tests do I need? Screening Your health care provider may recommend screening tests for certain conditions. This may include: Lipid and cholesterol levels. Diabetes screening.  This is done by checking your blood sugar (glucose) after you have not eaten for a while (fasting). Hepatitis C test. Hepatitis B test. HIV (human immunodeficiency virus) test. STI (sexually transmitted infection) testing, if you are at risk. Lung cancer screening. Colorectal cancer screening. Prostate cancer screening. Abdominal aortic aneurysm (AAA) screening. You may need this if you are a current or former smoker. Talk with your health care provider about your test results, treatment options, and if necessary, the need for more tests. Follow these instructions at home: Eating and drinking  Eat a diet that includes fresh fruits and vegetables, whole grains, lean protein, and low-fat dairy products. Limit your intake of foods with high amounts of sugar, saturated fats, and salt. Take vitamin and mineral supplements as recommended by your health care provider. Do not drink alcohol if your health care provider tells you not to drink. If you drink alcohol: Limit how much you have to 0-2 drinks a day. Know how much alcohol is in your drink. In the [place] one drink equals one 12 oz bottle of beer (355 mL), one 5 oz glass of wine (148 mL), or one 1 oz glass of hard liquor (44 mL). Lifestyle Brush your teeth every morning and night with fluoride toothpaste. Floss one time each day. Exercise for at least 30 minutes 5 or more days each week. Do not use any products that contain nicotine or tobacco. These products include cigarettes, chewing tobacco, and vaping devices, such as e-cigarettes. If you need help quitting, ask your health care provider. Do not use drugs. If you are sexually active, practice safe sex. Use a condom or other form of protection to prevent STIs. Take aspirin only as told by your health care  provider. Make sure that you understand how much to take and what form to take. Work with your health care provider to find out whether it is safe and beneficial for you to take aspirin  daily. Ask your health care provider if you need to take a cholesterol-lowering medicine (statin). Find healthy ways to manage stress, such as: Meditation, yoga, or listening to music. Journaling. Talking to a trusted person. Spending time with friends and family. Safety Always wear your seat belt while driving or riding in a vehicle. Do not drive: If you have been drinking alcohol. Do not ride with someone who has been drinking. When you are tired or distracted. While texting. If you have been using any mind-altering substances or drugs. Wear a helmet and other protective equipment during sports activities. If you have firearms in your house, make sure you follow all gun safety procedures. Minimize exposure to UV radiation to reduce your risk of skin cancer. What's next? Visit your health care provider once a year for an annual wellness visit. Ask your health care provider how often you should have your eyes and teeth checked. Stay up to date on all vaccines. This information is not intended to replace advice given to you by your health care provider. Make sure you discuss any questions you have with your health care provider. Document Revised: 03/14/2021 Document Reviewed: 03/14/2021 Elsevier Patient Education  [place].

## 2022-03-20 NOTE — Progress Notes (Signed)
Subjective:  Patient ID: Andrew Wolf, male    DOB: 05-18-1946  Age: 76 y.o. MRN: [id number]  CC:  Chief Complaint  Patient presents with   Annual Exam    Pt due for refill on a few maintenance medications but no concerns with these, otherwise pt is well will request vision screening from [facility] on [address] [place] and [place]     HPI [place] presents for Annual Exam Care team:  PCP, me Urology, Dr. Alvester Morin - appt soon for yearly exam. Psa with urology planned in August, then if normal may stop testing. Prostate nodule.   ENT Dr. Annalee Genta GI Dr. Matthias Hughs, [facility].  Onychomycosis Treated since December with Lamisil.  Improving at his March visit, continued Lamisil 250 mg daily. Not quite healed. Distal part of nail still discolored. Lamisil daily.  Lab Results  Component Value Date   ALT 11 12/21/2021   AST 16 12/21/2021   ALKPHOS 76 12/21/2021   BILITOT 0.4 12/21/2021   Diabetes screening with normal A1c in June of last year.  GERD  treated with Nexium, Pepcid - working ok. Treated by GI. New GI changed from BID PPI to H2 and PPI. Getting adjusted.   Cold sores Valtrex 2 g, repeat in 12 hours for acute flares. Needs refill. Last flare 1 month ago -used meds..   Hyperlipidemia: Treated with Lipitor 10 mg daily.no myalgias/side effects.  Lab Results  Component Value Date   CHOL 126 03/15/2021   HDL 44.10 03/15/2021   LDLCALC 59 03/15/2021   TRIG 116.0 03/15/2021   CHOLHDL 3 03/15/2021   Lab Results  Component Value Date   ALT 11 12/21/2021   AST 16 12/21/2021   ALKPHOS 76 12/21/2021   BILITOT 0.4 12/21/2021           09/17/2021   10:17 AM 03/15/2021   10:13 AM 09/14/2020   10:14 AM 03/23/2020   11:53 AM 09/16/2019   11:14 AM  Depression screen PHQ 2/9  Decreased Interest 0 0 0 0 0  Down, Depressed, Hopeless 0 0 0 0 0  PHQ - 2 Score 0 0 0 0 0  Altered sleeping 0 0     Tired, decreased energy 0 0     Change in appetite 0 0     Feeling bad or  failure about yourself  0 0     Trouble concentrating 0 0     Moving slowly or fidgety/restless 0 0     Suicidal thoughts 0 0     PHQ-9 Score 0 0     Difficult doing work/chores Not difficult at all        Health Maintenance  Topic Date Due   Zoster Vaccines- Shingrix (1 of 2) 03/21/2022 (Originally 11/27/1996)   COVID-19 Vaccine (5 - Moderna series) 04/05/2022 (Originally 09/24/2021)   INFLUENZA VACCINE  04/30/2022   TETANUS/TDAP  08/20/2023   COLONOSCOPY (Pts 45-12yrs Insurance coverage will need to be confirmed)  11/28/2025   Pneumonia Vaccine 53+ Years old  Completed   Hepatitis C Screening  Completed   HPV VACCINES  Aged Out  Colonoscopy 2017 - repeat 54yrs.  Prostate nodule followed by urology Lab Results  Component Value Date   PSA1 1.5 04/14/2018   PSA 1.27 08/19/2013   Immunization History  Administered Date(s) Administered   Influenza Split 07/10/2012   Influenza, High Dose Seasonal PF 08/28/2017, 08/24/2018, 06/17/2019   Influenza,inj,Quad PF,6+ Mos 07/15/2013, 07/16/2016   Influenza-Unspecified 08/06/2019, 05/05/2020, 09/03/2021  Moderna Sars-Covid-2 Vaccination 11/13/2019, 12/11/2019, 08/07/2020   PFIZER(Purple Top)SARS-COV-2 Vaccination 07/30/2021   Pneumococcal Conjugate-13 04/14/2018   Pneumococcal Polysaccharide-23 08/19/2013   Tdap 08/19/2013  Plans on repeat bivalent booster Shingrix recommended at his pharmacy.   No results found. Wears glasses - optho year ago.   Dental:every 82months. Appt tomorrow for crown.   Alcohol: occasional - less than 1 per week.   Tobacco: cigar less than once per week.   Exercise: in yardwork - approx per week.     History Patient Active Problem List   Diagnosis Date Noted   Hyperlipidemia 03/20/2022   GERD (gastroesophageal reflux disease) 02/11/2013   Past Medical History:  Diagnosis Date   Allergy    Anxiety    Depression    GERD (gastroesophageal reflux disease)    Past Surgical History:   Procedure Laterality Date   KNEE SURGERY  2004   No Known Allergies Prior to Admission medications   Medication Sig Start Date End Date Taking? Authorizing Provider  atorvastatin (LIPITOR) 10 MG tablet Take 1 tablet (10 mg total) by mouth daily. 09/17/21  Yes [name], MD  Cholecalciferol (VITAMIN D-3 PO) Take 1,000 Units by mouth daily.   Yes [provider]  Clobetasol Propionate 0.15 MG/ACT (0.05%) LOTN Apply to affected are on hands once per day as needed only. 03/23/20  Yes [name], MD  esomeprazole (NEXIUM) 40 MG capsule Take 40 mg by mouth daily.   Yes [provider]  Famotidine (PEPCID PO) Take by mouth every morning.   Yes [provider]  terbinafine (LAMISIL) 250 MG tablet Take 1 tablet (250 mg total) by mouth daily. 12/19/21  Yes [name], MD  triamcinolone (KENALOG) 0.1 % paste Use as directed 1 application in the mouth or throat 2 (two) times daily. Apply to oral ulcer up to twice per day if needed. 05/22/20  Yes [name], MD  triamcinolone cream (KENALOG) 0.1 % Apply 1 application topically 2 (two) times daily. 09/17/21  Yes [name], MD  valACYclovir (VALTREX) 1000 MG tablet Use 2 tablets initially, then 2 in 12 hours for fever blisters 09/14/20  Yes [name], MD   Social History   Socioeconomic History   Marital status: Married    Spouse name: Not on file   Number of children: Not on file   Years of education: Not on file   Highest education level: Not on file  Occupational History   Not on file  Tobacco Use   Smoking status: Never    Passive exposure: Yes   Smokeless tobacco: Never  Substance and Sexual Activity   Alcohol use: Yes    Alcohol/week: 1.0 standard drink of alcohol    Types: 1 Glasses of wine per week    Comment: rare   Drug use: No   Sexual activity: Yes  Other Topics Concern   Not on file  Social History Narrative   Not on file   Social Determinants of Health    Financial Resource Strain: Not on file  Food Insecurity: Not on file  Transportation Needs: Not on file  Physical Activity: Not on file  Stress: Not on file  Social Connections: Not on file  Intimate Partner Violence: Not on file    Review of Systems 13 point review of systems per patient health survey noted.  Negative other than as indicated above or in HPI.    Objective:   Vitals:   03/20/22 9892  BP: 128/70  Pulse: (!) 58  Resp: 16  Temp: 98.3 F (36.8 C)  TempSrc: Temporal  SpO2: 99%  Weight: 184 lb 4.8 oz (83.6 kg)  Height: 5\' 9"  (1.753 m)     Physical Exam Vitals reviewed.  Constitutional:      Appearance: He is well-developed.  HENT:     Head: Normocephalic and atraumatic.     Right Ear: External ear normal.     Left Ear: External ear normal.  Eyes:     Conjunctiva/sclera: Conjunctivae normal.     Pupils: Pupils are equal, round, and reactive to light.  Neck:     Thyroid: No thyromegaly.  Cardiovascular:     Rate and Rhythm: Normal rate and regular rhythm.     Heart sounds: Normal heart sounds.  Pulmonary:     Effort: Pulmonary effort is normal. No respiratory distress.     Breath sounds: Normal breath sounds. No wheezing.  Abdominal:     General: There is no distension.     Palpations: Abdomen is soft.     Tenderness: There is no abdominal tenderness.  Musculoskeletal:        General: No tenderness. Normal range of motion.     Cervical back: Normal range of motion and neck supple.  Lymphadenopathy:     Cervical: No cervical adenopathy.  Skin:    General: Skin is warm and dry.     Comments: Thick, discolored distal 1/4 of great toenails, surrounding skin appears normal   Neurological:     Mental Status: He is alert and oriented to person, place, and time.     Deep Tendon Reflexes: Reflexes are normal and symmetric.  Psychiatric:        Behavior: Behavior normal.        Assessment & Plan:  Andrew Wolf is a 76 y.o. male . Annual  physical exam - Plan: Comprehensive metabolic panel, Lipid panel  - -anticipatory guidance as below in AVS, screening labs above. Health maintenance items as above in HPI discussed/recommended as applicable.   Onychomycosis - Plan: terbinafine (LAMISIL) 250 MG tablet  -Improving, continue Lamisil, check LFTs.  Hyperlipidemia, unspecified hyperlipidemia type - Plan: Comprehensive metabolic panel, Lipid panel, atorvastatin (LIPITOR) 10 MG tablet  -Stable on Lipitor, check labs, continue same dose.  HSV-1 (herpes simplex virus 1) infection - Plan: valACYclovir (VALTREX) 1000 MG tablet  -Infrequent flares, Valtrex refilled for breakthrough symptoms, consider daily dosing if frequent flares.  Continue follow-up with specialist as planned including urologist for discussion of PSA.  Meds ordered this encounter  Medications   terbinafine (LAMISIL) 250 MG tablet    Sig: Take 1 tablet (250 mg total) by mouth daily.    Dispense:  90 tablet    Refill:  0   atorvastatin (LIPITOR) 10 MG tablet    Sig: Take 1 tablet (10 mg total) by mouth daily.    Dispense:  90 tablet    Refill:  2   valACYclovir (VALTREX) 1000 MG tablet    Sig: Use 2 tablets initially, then 2 in 12 hours for fever blisters    Dispense:  8 tablet    Refill:  5   Patient Instructions  Ok for repeat covid booster and shingles vaccine at your pharmacy.  No med changes today, will extend lamisil for a few more months.  Take care!  Preventive Care 46 Years and Older, Male Preventive care refers to lifestyle choices and visits with your health care provider that can promote health  and wellness. Preventive care visits are also called wellness exams. What can I expect for my preventive care visit? Counseling During your preventive care visit, your health care provider may ask about your: Medical history, including: Past medical problems. Family medical history. History of falls. Current health, including: Emotional  well-being. Home life and relationship well-being. Sexual activity. Memory and ability to understand (cognition). Lifestyle, including: Alcohol, nicotine or tobacco, and drug use. Access to firearms. Diet, exercise, and sleep habits. Work and work [employer]. Sunscreen use. Safety issues such as seatbelt and bike helmet use. Physical exam Your health care provider will check your: Height and weight. These may be used to calculate your BMI (body mass index). BMI is a measurement that tells if you are at a healthy weight. Waist circumference. This measures the distance around your waistline. This measurement also tells if you are at a healthy weight and may help predict your risk of certain diseases, such as type 2 diabetes and high blood pressure. Heart rate and blood pressure. Body temperature. Skin for abnormal spots. What immunizations do I need?  Vaccines are usually given at various ages, according to a schedule. Your health care provider will recommend vaccines for you based on your age, medical history, and lifestyle or other factors, such as travel or where you work. What tests do I need? Screening Your health care provider may recommend screening tests for certain conditions. This may include: Lipid and cholesterol levels. Diabetes screening. This is done by checking your blood sugar (glucose) after you have not eaten for a while (fasting). Hepatitis C test. Hepatitis B test. HIV (human immunodeficiency virus) test. STI (sexually transmitted infection) testing, if you are at risk. Lung cancer screening. Colorectal cancer screening. Prostate cancer screening. Abdominal aortic aneurysm (AAA) screening. You may need this if you are a current or former smoker. Talk with your health care provider about your test results, treatment options, and if necessary, the need for more tests. Follow these instructions at home: Eating and drinking  Eat a diet that includes fresh fruits  and vegetables, whole grains, lean protein, and low-fat dairy products. Limit your intake of foods with high amounts of sugar, saturated fats, and salt. Take vitamin and mineral supplements as recommended by your health care provider. Do not drink alcohol if your health care provider tells you not to drink. If you drink alcohol: Limit how much you have to 0-2 drinks a day. Know how much alcohol is in your drink. In the [place] one drink equals one 12 oz bottle of beer (355 mL), one 5 oz glass of wine (148 mL), or one 1 oz glass of hard liquor (44 mL). Lifestyle Brush your teeth every morning and night with fluoride toothpaste. Floss one time each day. Exercise for at least 30 minutes 5 or more days each week. Do not use any products that contain nicotine or tobacco. These products include cigarettes, chewing tobacco, and vaping devices, such as e-cigarettes. If you need help quitting, ask your health care provider. Do not use drugs. If you are sexually active, practice safe sex. Use a condom or other form of protection to prevent STIs. Take aspirin only as told by your health care provider. Make sure that you understand how much to take and what form to take. Work with your health care provider to find out whether it is safe and beneficial for you to take aspirin daily. Ask your health care provider if you need to take a cholesterol-lowering medicine (statin). Find  healthy ways to manage stress, such as: Meditation, yoga, or listening to music. Journaling. Talking to a trusted person. Spending time with friends and family. Safety Always wear your seat belt while driving or riding in a vehicle. Do not drive: If you have been drinking alcohol. Do not ride with someone who has been drinking. When you are tired or distracted. While texting. If you have been using any mind-altering substances or drugs. Wear a helmet and other protective equipment during sports activities. If you have firearms in  your house, make sure you follow all gun safety procedures. Minimize exposure to UV radiation to reduce your risk of skin cancer. What's next? Visit your health care provider once a year for an annual wellness visit. Ask your health care provider how often you should have your eyes and teeth checked. Stay up to date on all vaccines. This information is not intended to replace advice given to you by your health care provider. Make sure you discuss any questions you have with your health care provider. Document Revised: 03/14/2021 Document Reviewed: 03/14/2021 Elsevier Patient Education  2023 [employer]     Signed,   [name], MD  Primary Care, [facility] 03/20/22 10:14 AM

## 2022-03-21 ENCOUNTER — Ambulatory Visit: Payer: Federal, State, Local not specified - PPO | Admitting: Family Medicine

## 2022-09-18 ENCOUNTER — Encounter: Payer: Self-pay | Admitting: Family Medicine

## 2022-09-18 ENCOUNTER — Ambulatory Visit: Payer: Federal, State, Local not specified - PPO | Admitting: Family Medicine

## 2022-09-18 VITALS — BP 140/90 | HR 49 | Temp 98.1°F | Ht 69.0 in | Wt 186.8 lb

## 2022-09-18 DIAGNOSIS — E785 Hyperlipidemia, unspecified: Secondary | ICD-10-CM | POA: Diagnosis not present

## 2022-09-18 DIAGNOSIS — L309 Dermatitis, unspecified: Secondary | ICD-10-CM | POA: Diagnosis not present

## 2022-09-18 DIAGNOSIS — B009 Herpesviral infection, unspecified: Secondary | ICD-10-CM | POA: Diagnosis not present

## 2022-09-18 DIAGNOSIS — B351 Tinea unguium: Secondary | ICD-10-CM

## 2022-09-18 DIAGNOSIS — K219 Gastro-esophageal reflux disease without esophagitis: Secondary | ICD-10-CM | POA: Diagnosis not present

## 2022-09-18 DIAGNOSIS — R030 Elevated blood-pressure reading, without diagnosis of hypertension: Secondary | ICD-10-CM

## 2022-09-18 LAB — LIPID PANEL
Cholesterol: 147 mg/dL (ref 0–200)
HDL: 51.2 mg/dL (ref >39.00)
LDL Cholesterol: 69 mg/dL (ref 0–99)
NonHDL: 95.68
Total CHOL/HDL Ratio: 3
Triglycerides: 131 mg/dL (ref 0.0–149.0)
VLDL: 26.2 mg/dL (ref 0.0–40.0)

## 2022-09-18 LAB — COMPREHENSIVE METABOLIC PANEL
ALT: 14 U/L (ref 0–53)
AST: 18 U/L (ref 0–37)
Albumin: 4.4 g/dL (ref 3.5–5.2)
Alkaline Phosphatase: 97 U/L (ref 39–117)
BUN: 16 mg/dL (ref 6–23)
CO2: 30 mEq/L (ref 19–32)
Calcium: 9.7 mg/dL (ref 8.4–10.5)
Chloride: 102 mEq/L (ref 96–112)
Creatinine, Ser: 1.25 mg/dL (ref 0.40–1.50)
GFR: 56.21 mL/min — ABNORMAL LOW (ref >60.00)
Glucose, Bld: 91 mg/dL (ref 70–99)
Potassium: 4.9 mEq/L (ref 3.5–5.1)
Sodium: 138 mEq/L (ref 135–145)
Total Bilirubin: 0.6 mg/dL (ref 0.2–1.2)
Total Protein: 7.3 g/dL (ref 6.0–8.3)

## 2022-09-18 MED ORDER — ATORVASTATIN CALCIUM 10 MG PO TABS: 10.0000 mg | ORAL_TABLET | Freq: Every day | ORAL | 2 refills | Status: DC

## 2022-09-18 MED ORDER — CLOBETASOL PROPIONATE 0.15 MG/ACT (0.05%) EX LOTN: TOPICAL_LOTION | CUTANEOUS | 5 refills | Status: AC

## 2022-09-18 MED ORDER — VALACYCLOVIR HCL 500 MG PO TABS: 500.0000 mg | ORAL_TABLET | Freq: Every day | ORAL | 3 refills | Status: AC

## 2022-09-18 NOTE — Progress Notes (Signed)
Subjective:  Patient ID: Andrew Wolf, male    DOB: July 20, 1946  Age: 76 y.o. MRN: [id number]  CC:  Chief Complaint  Patient presents with   Nail Problem    Pt states he wants to know if there is something else he can do for the fungus   Hyperlipidemia    Pt states al is well    HPI Andrew Wolf presents for   Hyperlipidemia: Lipitor 10 mg daily, no new side effects.  Lab Results  Component Value Date   CHOL 151 03/20/2022   HDL 51.20 03/20/2022   LDLCALC 77 03/20/2022   TRIG 114.0 03/20/2022   CHOLHDL 3 03/20/2022   Lab Results  Component Value Date   ALT 18 03/20/2022   AST 20 03/20/2022   ALKPHOS 91 03/20/2022   BILITOT 0.5 03/20/2022   Hand dermatitis Treated with hydrating lotion call clotrimazole daily.  More frequently if washing hands more.  Managed well with daily moisturizer, and daily clobetasol. No side effects or skin changes. Possible side effects of topical steroids discussed.   GERD Evaluation with GI prior.  mild posterior glottic erythema consistent with chronic reflux but no mass or tumor with prior laryngoscopy.  Is treated with Nexium 40 mg daily, now on Pepcid 40 mg as well. New GI - Dr. Gerre Pebbles. Preferred prior regimen - will discuss with GI. Breakthrough heartburn.   Onychomycosis Has been treated since December of last year with Lamisil.  Was improved in March, not quite completely healed at his June visit.  Still with some distal portion of the nail discolored.  Continued Lamisil at that time.  Normal LFTs. Off lamisil since July, concerned about heartburn as above. Heartburn better off the lamisil though next day.  Nails look the same.   Lab Results  Component Value Date   ALT 18 03/20/2022   AST 20 03/20/2022   ALKPHOS 91 03/20/2022   BILITOT 0.5 03/20/2022   History of cold sores Valtrex previously.  2 grams x2 at flare. We have also prescribed Kenalog in [place] for aphthous ulcers. Few weeks ago had flare, happens monthly for a  few months, then skips few months. Not taking daily. Has not needed steroid paste recently.   Elevated BP - no meds. No HA/CP/dizziness. Plans to check readings at home.  History Patient Active Problem List   Diagnosis Date Noted   Hyperlipidemia 03/20/2022   Gastroesophageal reflux disease without esophagitis 02/11/2013   Past Medical History:  Diagnosis Date   Allergy    Anxiety    Depression    GERD (gastroesophageal reflux disease)    Past Surgical History:  Procedure Laterality Date   KNEE SURGERY  2004   No Known Allergies Prior to Admission medications   Medication Sig Start Date End Date Taking? Authorizing Provider  atorvastatin (LIPITOR) 10 MG tablet Take 1 tablet (10 mg total) by mouth daily. 03/20/22  Yes [name], MD  Cholecalciferol (VITAMIN D-3 PO) Take 1,000 Units by mouth daily.   Yes [provider]  Clobetasol Propionate 0.05 % lotion Apply topically. 03/23/20  Yes [provider]  Clobetasol Propionate 0.15 MG/ACT (0.05%) LOTN Apply to affected are on hands once per day as needed only. 03/23/20  Yes [name], MD  esomeprazole (NEXIUM) 40 MG capsule Take 40 mg by mouth daily.   Yes [provider]  Famotidine (PEPCID PO) Take by mouth every morning.   Yes [provider]  famotidine (PEPCID) 40 MG tablet  Take 40 mg by mouth every morning. 08/30/22  Yes [provider]  triamcinolone (KENALOG) 0.1 % paste Use as directed 1 application in the mouth or throat 2 (two) times daily. Apply to oral ulcer up to twice per day if needed. 05/22/20  Yes Wendie Agreste, MD  triamcinolone cream (KENALOG) 0.1 % Apply 1 application topically 2 (two) times daily. 09/17/21  Yes Wendie Agreste, MD  valACYclovir (VALTREX) 1000 MG tablet Use 2 tablets initially, then 2 in 12 hours for fever blisters 03/20/22  Yes Wendie Agreste, MD  valACYclovir (VALTREX) 1000 MG tablet Take by mouth. 06/22/20  Yes [provider]   terbinafine (LAMISIL) 250 MG tablet Take 1 tablet (250 mg total) by mouth daily. Patient not taking: Reported on 09/18/2022 03/20/22   Wendie Agreste, MD   Social History   Socioeconomic History   Marital status: Married    Spouse name: Not on file   Number of children: Not on file   Years of education: Not on file   Highest education level: Not on file  Occupational History   Not on file  Tobacco Use   Smoking status: Never    Passive exposure: Yes   Smokeless tobacco: Never  Substance and Sexual Activity   Alcohol use: Yes    Alcohol/week: 1.0 standard drink of alcohol    Types: 1 Glasses of wine per week    Comment: rare   Drug use: No   Sexual activity: Yes  Other Topics Concern   Not on file  Social History Narrative   Not on file   Social Determinants of Health   Financial Resource Strain: Not on file  Food Insecurity: Not on file  Transportation Needs: Not on file  Physical Activity: Not on file  Stress: Not on file  Social Connections: Not on file  Intimate Partner Violence: Not on file    Review of Systems Per HPI.   Objective:   Vitals:   09/18/22 1133 09/18/22 1139  BP: (!) 146/98 (!) 140/90  Pulse: (!) 49   Temp: 98.1 F (36.7 C)   SpO2: 98%   Weight: 186 lb 12.8 oz (84.7 kg)   Height: 5\' 9"  (1.753 m)    BP Readings from Last 3 Encounters:  09/18/22 (!) 140/90  03/20/22 128/70  12/19/21 110/70      Physical Exam Vitals reviewed.  Constitutional:      Appearance: He is well-developed.  HENT:     Head: Normocephalic and atraumatic.  Neck:     Vascular: No carotid bruit or JVD.  Cardiovascular:     Rate and Rhythm: Normal rate and regular rhythm.     Heart sounds: Normal heart sounds. No murmur heard. Pulmonary:     Effort: Pulmonary effort is normal.     Breath sounds: Normal breath sounds. No rales.  Musculoskeletal:     Right lower leg: No edema.     Left lower leg: No edema.  Skin:    General: Skin is warm and dry.      Comments: Right greater than left palm with slight thickening of palmar skin without erythema or significant rash appreciated.  Bilateral great toenails thickened, discolored without surrounding skin changes  Neurological:     Mental Status: He is alert and oriented to person, place, and time.  Psychiatric:        Mood and Affect: Mood normal.        Assessment & Plan:  Andrew Wolf  is a 75 y.o. male . HSV-1 (herpes simplex virus 1) infection - Plan: valACYclovir (VALTREX) 500 MG tablet  -Start daily dosing to lessen risk of flare, with dosing discussed for flares.  Hand dermatitis - Plan: Clobetasol Propionate 0.15 MG/ACT (0.05%) LOTN  -Stable with current regimen, continue hydrating lotion, clobetasol daily with potential side effects and risk discussed.  Hyperlipidemia, unspecified hyperlipidemia type - Plan: Comprehensive metabolic panel, Lipid panel, atorvastatin (LIPITOR) 10 MG tablet  -  Stable, tolerating current regimen. Medications refilled. Labs pending as above.   Gastroesophageal reflux disease, unspecified whether esophagitis present  -Some flare in symptoms with change from twice daily PPI to daily PPI plus H2 blocker.  Also notes some heartburn that worsened with use of Lamisil, then improved off Lamisil.  Less likely cause, but recommended he discuss with his gastroenterologist, hold on Lamisil for now.  Onychomycosis - Plan: Ambulatory referral to Dermatology  -Persistent great toenails after prolonged treatment as above.  Refer to dermatology for options, especially with question of Lamisil causing heartburn, unlikely cause as above.  Blood pressure elevated without history of HTN  -Elevated reading in office, no prior history of hypertension.  Check home readings, 6-week follow-up in office with RTC precautions.  Meds ordered this encounter  Medications   Clobetasol Propionate 0.15 MG/ACT (0.05%) LOTN    Sig: Apply to affected are on hands once per day as  needed only.    Dispense:  68 g    Refill:  5   valACYclovir (VALTREX) 500 MG tablet    Sig: Take 1 tablet (500 mg total) by mouth daily. Increase to 4 tabs once, repeat dose once in 12 hours if flare occurs.    Dispense:  90 tablet    Refill:  3   atorvastatin (LIPITOR) 10 MG tablet    Sig: Take 1 tablet (10 mg total) by mouth daily.    Dispense:  90 tablet    Refill:  2   Patient Instructions  Start Valtrex once per day to see if that lessens frequency of cold sore flares.  If you do have a cold sore flare, take 4 pills at once followed by 4 pills in 12 hours. I will refer you to dermatology to discuss options for the toenails.  It would be less likely for Lamisil to cause heartburn, but we can hold off on that medication for now and I recommend discussing your heartburn medications with gastroenterology. No med changes for now.   Blood pressure elevated today.  Keep a record of your blood pressures outside of the office and bring them to the next office visit. Recheck in next 6 weeks.  Return to the clinic or go to the nearest emergency room if any of your symptoms worsen or new symptoms occur.   How to Take Your Blood Pressure Blood pressure is a measurement of how strongly your blood is pressing against the walls of your arteries. Arteries are blood vessels that carry blood from your heart throughout your body. Your health care provider takes your blood pressure at each office visit. You can also take your own blood pressure at home with a blood pressure monitor. You may need to take your own blood pressure to: Confirm a diagnosis of high blood pressure (hypertension). Monitor your blood pressure over time. Make sure your blood pressure medicine is working. Supplies needed: Blood pressure monitor. A chair to sit in. This should be a chair where you can sit upright with your back supported. Do  not sit on a soft couch or an armchair. Table or desk. Small notebook and pencil or  pen. How to prepare To get the most accurate reading, avoid the following for 30 minutes before you check your blood pressure: Drinking caffeine. Drinking alcohol. Eating. Smoking. Exercising. Five minutes before you check your blood pressure: Use the bathroom and urinate so that you have an empty bladder. Sit quietly in a chair. Do not talk. How to take your blood pressure To check your blood pressure, follow the instructions in the manual that came with your blood pressure monitor. If you have a digital blood pressure monitor, the instructions may be as follows: Sit up straight in a chair. Place your feet on the floor. Do not cross your ankles or legs. Rest your left arm at the level of your heart on a table or desk or on the arm of a chair. Pull up your shirt sleeve. Wrap the blood pressure cuff around the upper part of your left arm, 1 inch (2.5 cm) above your elbow. It is best to wrap the cuff around bare skin. Fit the cuff snugly, but not too tightly, around your arm. You should be able to place only one finger between the cuff and your arm. Position the cord so that it rests in the bend of your elbow. Press the power button. Sit quietly while the cuff inflates and deflates. Read the digital reading on the monitor screen and write the numbers down (record them) in a notebook. Wait 2-3 minutes, then repeat the steps, starting at step 1. What does my blood pressure reading mean? A blood pressure reading consists of a higher number over a lower number. Ideally, your blood pressure should be below 120/80. The first ("top") number is called the systolic pressure. It is a measure of the pressure in your arteries as your heart beats. The second ("bottom") number is called the diastolic pressure. It is a measure of the pressure in your arteries as the heart relaxes. Blood pressure is classified into four stages. The following are the stages for adults who do not have a short-term serious  illness or a chronic condition. Systolic pressure and diastolic pressure are measured in a unit called mm Hg (millimeters of mercury).  Normal Systolic pressure: below 123456. Diastolic pressure: below 80. Elevated Systolic pressure: [id number]. Diastolic pressure: below 80. Hypertension stage 1 Systolic pressure: 0000000. Diastolic pressure: [id number]. Hypertension stage 2 Systolic pressure: [id number] or above. Diastolic pressure: 90 or above. You can have elevated blood pressure or hypertension even if only the systolic or only the diastolic number in your reading is higher than normal. Follow these instructions at home: Medicines Take over-the-counter and prescription medicines only as told by your health care provider. Tell your health care provider if you are having any side effects from blood pressure medicine. General instructions Check your blood pressure as often as recommended by your health care provider. Check your blood pressure at the same time every day. Take your monitor to the next appointment with your health care provider to make sure that: You are using it correctly. It provides accurate readings. Understand what your goal blood pressure numbers are. Keep all follow-up visits. This is important. General tips Your health care provider can suggest a reliable monitor that will meet your needs. There are several types of home blood pressure monitors. Choose a monitor that has an arm cuff. Do not choose a monitor that measures your blood pressure from your wrist or finger. Choose  a cuff that wraps snugly, not too tight or too loose, around your upper arm. You should be able to fit only one finger between your arm and the cuff. You can buy a blood pressure monitor at most drugstores or online. Where to find more information American Heart Association: www.heart.org Contact a health care provider if: Your blood pressure is consistently high. Your blood pressure is suddenly low. Get  help right away if: Your systolic blood pressure is higher than 180. Your diastolic blood pressure is higher than 120. These symptoms may be an emergency. Get help right away. Call 911. Do not wait to see if the symptoms will go away. Do not drive yourself to the hospital. Summary Blood pressure is a measurement of how strongly your blood is pressing against the walls of your arteries. A blood pressure reading consists of a higher number over a lower number. Ideally, your blood pressure should be below 120/80. Check your blood pressure at the same time every day. Avoid caffeine, alcohol, smoking, and exercise for 30 minutes prior to checking your blood pressure. These agents can affect the accuracy of the blood pressure reading. This information is not intended to replace advice given to you by your health care provider. Make sure you discuss any questions you have with your health care provider. Document Revised: 05/31/2021 Document Reviewed: 05/31/2021 Elsevier Patient Education  [place],   Merri Ray, MD [facility] 09/18/22 12:16 PM

## 2022-09-18 NOTE — Patient Instructions (Addendum)
Start Valtrex once per day to see if that lessens frequency of cold sore flares.  If you do have a cold sore flare, take 4 pills at once followed by 4 pills in 12 hours. I will refer you to dermatology to discuss options for the toenails.  It would be less likely for Lamisil to cause heartburn, but we can hold off on that medication for now and I recommend discussing your heartburn medications with gastroenterology. No med changes for now.   Blood pressure elevated today.  Keep a record of your blood pressures outside of the office and bring them to the next office visit. Recheck in next 6 weeks.  Return to the clinic or go to the nearest emergency room if any of your symptoms worsen or new symptoms occur.   How to Take Your Blood Pressure Blood pressure is a measurement of how strongly your blood is pressing against the walls of your arteries. Arteries are blood vessels that carry blood from your heart throughout your body. Your health care provider takes your blood pressure at each office visit. You can also take your own blood pressure at home with a blood pressure monitor. You may need to take your own blood pressure to: Confirm a diagnosis of high blood pressure (hypertension). Monitor your blood pressure over time. Make sure your blood pressure medicine is working. Supplies needed: Blood pressure monitor. A chair to sit in. This should be a chair where you can sit upright with your back supported. Do not sit on a soft couch or an armchair. Table or desk. Small notebook and pencil or pen. How to prepare To get the most accurate reading, avoid the following for 30 minutes before you check your blood pressure: Drinking caffeine. Drinking alcohol. Eating. Smoking. Exercising. Five minutes before you check your blood pressure: Use the bathroom and urinate so that you have an empty bladder. Sit quietly in a chair. Do not talk. How to take your blood pressure To check your blood  pressure, follow the instructions in the manual that came with your blood pressure monitor. If you have a digital blood pressure monitor, the instructions may be as follows: Sit up straight in a chair. Place your feet on the floor. Do not cross your ankles or legs. Rest your left arm at the level of your heart on a table or desk or on the arm of a chair. Pull up your shirt sleeve. Wrap the blood pressure cuff around the upper part of your left arm, 1 inch (2.5 cm) above your elbow. It is best to wrap the cuff around bare skin. Fit the cuff snugly, but not too tightly, around your arm. You should be able to place only one finger between the cuff and your arm. Position the cord so that it rests in the bend of your elbow. Press the power button. Sit quietly while the cuff inflates and deflates. Read the digital reading on the monitor screen and write the numbers down (record them) in a notebook. Wait 2-3 minutes, then repeat the steps, starting at step 1. What does my blood pressure reading mean? A blood pressure reading consists of a higher number over a lower number. Ideally, your blood pressure should be below 120/80. The first ("top") number is called the systolic pressure. It is a measure of the pressure in your arteries as your heart beats. The second ("bottom") number is called the diastolic pressure. It is a measure of the pressure in your arteries as the  heart relaxes. Blood pressure is classified into four stages. The following are the stages for adults who do not have a short-term serious illness or a chronic condition. Systolic pressure and diastolic pressure are measured in a unit called mm Hg (millimeters of mercury).  Normal Systolic pressure: below 120. Diastolic pressure: below 80. Elevated Systolic pressure: 120-129. Diastolic pressure: below 80. Hypertension stage 1 Systolic pressure: 130-139. Diastolic pressure: 80-89. Hypertension stage 2 Systolic pressure: 140 or  above. Diastolic pressure: 90 or above. You can have elevated blood pressure or hypertension even if only the systolic or only the diastolic number in your reading is higher than normal. Follow these instructions at home: Medicines Take over-the-counter and prescription medicines only as told by your health care provider. Tell your health care provider if you are having any side effects from blood pressure medicine. General instructions Check your blood pressure as often as recommended by your health care provider. Check your blood pressure at the same time every day. Take your monitor to the next appointment with your health care provider to make sure that: You are using it correctly. It provides accurate readings. Understand what your goal blood pressure numbers are. Keep all follow-up visits. This is important. General tips Your health care provider can suggest a reliable monitor that will meet your needs. There are several types of home blood pressure monitors. Choose a monitor that has an arm cuff. Do not choose a monitor that measures your blood pressure from your wrist or finger. Choose a cuff that wraps snugly, not too tight or too loose, around your upper arm. You should be able to fit only one finger between your arm and the cuff. You can buy a blood pressure monitor at most drugstores or online. Where to find more information American Heart Association: www.heart.org Contact a health care provider if: Your blood pressure is consistently high. Your blood pressure is suddenly low. Get help right away if: Your systolic blood pressure is higher than 180. Your diastolic blood pressure is higher than 120. These symptoms may be an emergency. Get help right away. Call 911. Do not wait to see if the symptoms will go away. Do not drive yourself to the hospital. Summary Blood pressure is a measurement of how strongly your blood is pressing against the walls of your arteries. A blood  pressure reading consists of a higher number over a lower number. Ideally, your blood pressure should be below 120/80. Check your blood pressure at the same time every day. Avoid caffeine, alcohol, smoking, and exercise for 30 minutes prior to checking your blood pressure. These agents can affect the accuracy of the blood pressure reading. This information is not intended to replace advice given to you by your health care provider. Make sure you discuss any questions you have with your health care provider. Document Revised: 05/31/2021 Document Reviewed: 05/31/2021 Elsevier Patient Education  2023 [employer].

## 2022-11-22 ENCOUNTER — Other Ambulatory Visit: Payer: Self-pay | Admitting: Family Medicine

## 2022-11-22 DIAGNOSIS — E785 Hyperlipidemia, unspecified: Secondary | ICD-10-CM
